# Patient Record
Sex: Female | Born: 1970 | Race: White | Hispanic: No
Health system: Western US, Academic
[De-identification: ages and names within clinical notes are randomized; demographics above are authoritative.]

## PROBLEM LIST (undated history)

## (undated) DIAGNOSIS — C4491 Basal cell carcinoma of skin, unspecified: Secondary | ICD-10-CM

## (undated) DIAGNOSIS — J302 Other seasonal allergic rhinitis: Secondary | ICD-10-CM

## (undated) HISTORY — PX: INNER EAR SURGERY: SHX679

## (undated) HISTORY — PX: MOHS SURGERY: SUR867

## (undated) HISTORY — PX: EYE SURGERY: SHX253

## (undated) HISTORY — PX: WISDOM TOOTH EXTRACTION: SHX21

## (undated) HISTORY — DX: Basal cell carcinoma of skin, unspecified: C44.91

## (undated) HISTORY — DX: Other seasonal allergic rhinitis: J30.2

---
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
## 1990-07-14 DIAGNOSIS — C4491 Basal cell carcinoma of skin, unspecified: Secondary | ICD-10-CM

## 1990-07-14 HISTORY — DX: Basal cell carcinoma of skin, unspecified: C44.91

## 2000-10-16 ENCOUNTER — Ambulatory Visit (HOSPITAL_BASED_OUTPATIENT_CLINIC_OR_DEPARTMENT_OTHER): Admission: RE | Admit: 2000-10-16 | Discharge: 2000-10-16 | Payer: Self-pay | Admitting: Otolaryngology

## 2001-07-23 ENCOUNTER — Other Ambulatory Visit: Admission: RE | Admit: 2001-07-23 | Discharge: 2001-07-23 | Payer: Self-pay | Admitting: Obstetrics and Gynecology

## 2002-08-04 ENCOUNTER — Other Ambulatory Visit: Admission: RE | Admit: 2002-08-04 | Discharge: 2002-08-04 | Payer: Self-pay | Admitting: Obstetrics and Gynecology

## 2002-12-29 ENCOUNTER — Emergency Department (HOSPITAL_COMMUNITY): Admission: EM | Admit: 2002-12-29 | Discharge: 2002-12-30 | Payer: Self-pay | Admitting: Emergency Medicine

## 2002-12-30 ENCOUNTER — Encounter: Payer: Self-pay | Admitting: Emergency Medicine

## 2003-02-28 ENCOUNTER — Other Ambulatory Visit: Admission: RE | Admit: 2003-02-28 | Discharge: 2003-02-28 | Payer: Self-pay | Admitting: Obstetrics and Gynecology

## 2003-08-08 ENCOUNTER — Other Ambulatory Visit: Admission: RE | Admit: 2003-08-08 | Discharge: 2003-08-08 | Payer: Self-pay | Admitting: Obstetrics and Gynecology

## 2004-10-29 ENCOUNTER — Other Ambulatory Visit: Admission: RE | Admit: 2004-10-29 | Discharge: 2004-10-29 | Payer: Self-pay | Admitting: Obstetrics and Gynecology

## 2007-03-19 ENCOUNTER — Ambulatory Visit: Payer: Self-pay | Admitting: Family Medicine

## 2007-08-20 ENCOUNTER — Ambulatory Visit: Admission: RE | Admit: 2007-08-20 | Discharge: 2007-08-20 | Payer: Self-pay | Admitting: Obstetrics and Gynecology

## 2007-08-20 ENCOUNTER — Encounter (INDEPENDENT_AMBULATORY_CARE_PROVIDER_SITE_OTHER): Payer: Self-pay | Admitting: Obstetrics and Gynecology

## 2007-08-20 ENCOUNTER — Ambulatory Visit: Payer: Self-pay | Admitting: Vascular Surgery

## 2007-08-25 ENCOUNTER — Encounter: Admission: RE | Admit: 2007-08-25 | Discharge: 2007-08-25 | Payer: Self-pay | Admitting: Rheumatology

## 2009-03-14 ENCOUNTER — Encounter: Admission: RE | Admit: 2009-03-14 | Discharge: 2009-03-14 | Payer: Self-pay | Admitting: Obstetrics and Gynecology

## 2009-04-25 ENCOUNTER — Ambulatory Visit (HOSPITAL_COMMUNITY): Admission: RE | Admit: 2009-04-25 | Discharge: 2009-04-25 | Payer: Self-pay | Admitting: Family Medicine

## 2010-10-08 ENCOUNTER — Other Ambulatory Visit: Payer: Self-pay | Admitting: Obstetrics and Gynecology

## 2010-10-08 DIAGNOSIS — N6325 Unspecified lump in the left breast, overlapping quadrants: Secondary | ICD-10-CM

## 2010-10-11 ENCOUNTER — Ambulatory Visit
Admission: RE | Admit: 2010-10-11 | Discharge: 2010-10-11 | Disposition: A | Discharge status: Home / Self Care | Payer: Managed Care, Other (non HMO) | Source: Ambulatory Visit | Attending: Obstetrics and Gynecology | Admitting: Obstetrics and Gynecology

## 2010-10-11 DIAGNOSIS — N6325 Unspecified lump in the left breast, overlapping quadrants: Secondary | ICD-10-CM

## 2010-11-29 NOTE — Op Note (Signed)
[place]. [facility]  Patient:    Anna Bauer, Anna Bauer                    MRN: [id number] Proc. Date: 10/16/00 Adm. 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  OPERATION:  Left type 1 tympanoplasty with ossicular reconstruction with torque prosthesis (Goldenberg incus stapes prosthesis).  SURGEON:  Kristine Garbe. Ezzard Standing, M.D.  ANESTHESIA:  General endotracheal.  COMPLICATIONS:  None.  BRIEF CLINICAL NOTE:  The patient is a 40 year old female who has had a chronic ear problem with hearing loss.  On examination of the left ear, she has a large posterior-superior TM perforation with obvious erosion of the long process of the incus.  She has a 30 decibel conductive hearing loss with worsening of her hearing over the last several years.  She has not had any significant drainage from the ear.  She is taken to the operating room at this time for tympanoplasty and ossicular reconstruction.  DESCRIPTION OF PROCEDURE:  After adequate endotracheal anesthesia, the patient received 1 g of Ancef IV preoperatively.  The ear was prepped with Betadine solution, and draped out with sterile towels.  The ear canal was then injected with Xylocaine with 100,000 epinephrine for hemostasis.  A posterior-based tympanomeatal flap was elevated, but the ear canal was very small, and because of this, it was elected to go postauricular.  A postauricular incision was made initially superiorly and a temporalis fascia graft was harvested and sat aside to dry.  The incision was then carried down posteriorly, and the ear was reflected forward, and the ear canal was entered, and the  posterior tympanomeatal flap was elevated via postauricular approach.  Using the drill, the bony canal was enlarged slightly posteriorly.  Because of difficult exposure of the posterior-superior aspect via the postauricular approach, the ________ approach was then utilized using a 6.5 speculum.  The patient had a large posterior-superior perforation extending up to the malleus.  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  The posterior tympanomeatal flap was elevated down to the annulus which was elevated and reflected anteriorly, and the posterior portion of the middle ear space was exposed.  On evaluation of the incus and stapes area, the long parts of the incus was eroded and missing.  Initially, I thought the patient had a stapes super structure, but there was just scar tissue and no bony super structure intact. The scar tissue was removed from the oval window area, and the oval window was visualized.  The oval window was mobile, but there was no stapes super structure.  There was no evidence of a cholesteatoma.  It was elected to use a Goldenberg torque prosthesis to position on the oval window up to the under surface of the malleus.  This was subsequently positioned.  The fascia graft was then placed medial to the tympanomeatal flap overlying the torque prosthesis, and extending up to the malleus.  A separate piece of fascia was used to lie medial to the inferior aspect of the perforation.  The middle ear space was then packed  with Gelfoam soaked in _________.  The tympanomeatal flap was brought back posteriorly over the torque prosthesis.  The perforation site was entirely covered with the fascia graft, and the ear canal was packed with Gelfoam soaked in ________.  The postauricular incision was closed with 3-0 chromic suture subcutaneously and 4-0 nylon on the skin.  A mastoid dressing was applied.  The patient  was awakened from anesthesia and transferred from the recovery room postoperative doing well.  DISPOSITION:  The patient is discharged home on Keflex 500 b.i.d. for a week, Tylenol and Vicodin for pain.  Told to follow up in my office in one week for recheck, and to remove postauricular sutures. DD:  10/16/00 TD:  10/17/00 Job: [id number]

## 2012-07-28 ENCOUNTER — Ambulatory Visit (INDEPENDENT_AMBULATORY_CARE_PROVIDER_SITE_OTHER): Payer: BC Managed Care – PPO | Admitting: Family Medicine

## 2012-07-28 ENCOUNTER — Encounter: Payer: Self-pay | Admitting: Family Medicine

## 2012-07-28 VITALS — BP 120/88 | HR 76 | Temp 98.2°F | Ht 65.0 in | Wt 130.0 lb

## 2012-07-28 DIAGNOSIS — F419 Anxiety disorder, unspecified: Secondary | ICD-10-CM | POA: Insufficient documentation

## 2012-07-28 DIAGNOSIS — R0600 Dyspnea, unspecified: Secondary | ICD-10-CM

## 2012-07-28 DIAGNOSIS — R0609 Other forms of dyspnea: Secondary | ICD-10-CM

## 2012-07-28 DIAGNOSIS — Z136 Encounter for screening for cardiovascular disorders: Secondary | ICD-10-CM

## 2012-07-28 DIAGNOSIS — F411 Generalized anxiety disorder: Secondary | ICD-10-CM

## 2012-07-28 DIAGNOSIS — R079 Chest pain, unspecified: Secondary | ICD-10-CM | POA: Insufficient documentation

## 2012-07-28 DIAGNOSIS — Z23 Encounter for immunization: Secondary | ICD-10-CM

## 2012-07-28 MED ORDER — ALBUTEROL SULFATE HFA 108 (90 BASE) MCG/ACT IN AERS: 2.0000 | INHALATION_SPRAY | Freq: Four times a day (QID) | RESPIRATORY_TRACT | Status: DC | PRN

## 2012-07-28 MED ORDER — AZITHROMYCIN 250 MG PO TABS: ORAL_TABLET | ORAL | Status: DC

## 2012-07-28 NOTE — Addendum Note (Signed)
Addended by: Eliezer Bottom on: 07/28/2012 04:53 PM   Modules accepted: Orders

## 2012-07-28 NOTE — Progress Notes (Signed)
Subjective:    Patient ID: Anna Bauer, female    DOB: 08-23-1970, 42 y.o.   MRN: [id number]  HPI  Very pleasant 42 yo female here to establish care with complaint of chest pain.  Past week and a half, she has noted some chest tightness and DOE. Very active- exercises at the gym regularly.  Notices that she does feel more fatigued and short of breath with exertion. She is a non smoker. Her father has a h/o CAD- first MI at 46.  She has not had lipids checked recently.  She has been under more stress at work.  Has had times when she feels like she is having a panic attack. Denies feeling depressed.  She has had panic attacks in past.  Never has taken any antidepressants or anxiolytics.  Has had some nasal drainage this past week.  Patient Active Problem List  Diagnosis  . Chest pain  . Anxiety  . DOE (dyspnea on exertion)   No past medical history on file. Past Surgical History  Procedure Date  . Tonsillectomy 1978   History  Substance Use Topics  . Smoking status: Former Games developer  . Smokeless tobacco: Not on file     Comment: Smoked for one year as a teenager  . Alcohol Use: Not on file   Family History  Problem Relation Age of Onset  . Heart disease Mother 78    MI  . Hypertension Father    No Known Allergies Current Outpatient Prescriptions on File Prior to Visit  Medication Sig Dispense Refill  . norethindrone-ethinyl estradiol 1/35 (ORTHO-NOVUM, NORTREL,CYCLAFEM) tablet Take 1 tablet by mouth daily.       The PMH, PSH, Social History, Family History, Medications, and allergies have been reviewed in [facility], and have been updated if relevant.    Review of Systems See HPI Denies any changes in her bowel habits Appetite good No cough    Objective:   Physical Exam BP 120/88  Pulse 76  Temp 98.2 F (36.8 C)  Ht 5\' 5"  (1.651 m)  Wt 130 lb (58.968 kg)  BMI 21.63 kg/m2  General:  Well-developed,well-nourished,in no acute distress; alert,appropriate and  cooperative throughout examination Head:  normocephalic and atraumatic.   Eyes:  vision grossly intact, pupils equal, pupils round, and pupils reactive to light.   Ears:  R ear normal and L ear normal.   Nose:  no external deformity.   Lungs:  Normal respiratory effort, chest expands symmetrically.  Wheezes right lower lung base with scattered crackles Heart:  Normal rate and regular rhythm. S1 and S2 normal without gallop, murmur, click, rub or other extra sounds. Abdomen:  Bowel sounds positive,abdomen soft and non-tender without masses, organomegaly or hernias noted. Msk:  No deformity or scoliosis noted of thoracic or lumbar spine.   Extremities:  No clubbing, cyanosis, edema, or deformity noted with normal full range of motion of all joints.   Neurologic:  alert & oriented X3 and gait normal.   Skin:  Intact without suspicious lesions or rashes Psych:  Cognition and judgment appear intact. Alert and cooperative with normal attention span and concentration. No apparent delusions, illusions, hallucinations    Assessment & Plan:   1. Chest pain  New- EKG reassuring- NSR. I suspect this is coming from bronchitis- wheezes heard on exam today.  Given duration and progression of symptoms, will treat with Zpack, Proair as needed. EKG 12-Lead  2. Anxiety  Deteriorated but she would like to defer tx at this time.  She states she already feels less anxious knowing EKG was wnl.   3. DOE (dyspnea on exertion)  New- hopefully will improve with resolution of bronchitis.  Will check labs to rule out other possible contributing factors. The patient indicates understanding of these issues and agrees with the plan.  CBC with Differential, TSH, T4, Free, Comprehensive metabolic panel, EKG 12-Lead  4. Screening for ischemic heart disease  Lipid Panel

## 2012-07-28 NOTE — Patient Instructions (Addendum)
Nice to meet you. Take Zpack as directed. You may use the proair inhaler as needed for shortness of breath, chest tightness. Mucinex for a few days may help break up the secretions.  Call me next week with an update. We will call you with your lab results.

## 2012-07-29 ENCOUNTER — Encounter: Payer: Self-pay | Admitting: *Deleted

## 2012-07-29 LAB — CBC WITH DIFFERENTIAL/PLATELET
Basos: 0 % (ref 0–3)
Eos: 3 % (ref 0–5)
HCT: 41.9 % (ref 34.0–46.6)
Hemoglobin: 13.9 g/dL (ref 11.1–15.9)
Immature Granulocytes: 0 % (ref 0–2)
Lymphocytes Absolute: 2.1 10*3/uL (ref 0.7–3.1)
Monocytes: 9 % (ref 4–12)
RBC: 4.36 x10E6/uL (ref 3.77–5.28)
RDW: 13 % (ref 12.3–15.4)
WBC: 9.4 10*3/uL (ref 3.4–10.8)

## 2012-07-29 LAB — COMPREHENSIVE METABOLIC PANEL
AST: 23 IU/L (ref 0–40)
Albumin/Globulin Ratio: 1.4 (ref 1.1–2.5)
Alkaline Phosphatase: 60 IU/L (ref 39–117)
BUN/Creatinine Ratio: 19 (ref 9–23)
Creatinine, Ser: 0.75 mg/dL (ref 0.57–1.00)
GFR calc non Af Amer: 99 mL/min/{1.73_m2} (ref >59)
Globulin, Total: 3.1 g/dL (ref 1.5–4.5)
Potassium: 4.1 mmol/L (ref 3.5–5.2)
Sodium: 138 mmol/L (ref 134–144)
Total Bilirubin: 0.3 mg/dL (ref 0.0–1.2)

## 2012-07-29 LAB — LIPID PANEL: Chol/HDL Ratio: 2.5 ratio units (ref 0.0–4.4)

## 2012-08-19 ENCOUNTER — Ambulatory Visit (INDEPENDENT_AMBULATORY_CARE_PROVIDER_SITE_OTHER): Payer: BC Managed Care – PPO | Admitting: Family Medicine

## 2012-08-19 ENCOUNTER — Encounter: Payer: Self-pay | Admitting: Family Medicine

## 2012-08-19 VITALS — BP 100/60 | HR 70 | Temp 98.2°F | Ht 65.0 in | Wt 130.5 lb

## 2012-08-19 DIAGNOSIS — M25476 Effusion, unspecified foot: Secondary | ICD-10-CM

## 2012-08-19 DIAGNOSIS — M25473 Effusion, unspecified ankle: Secondary | ICD-10-CM

## 2012-08-19 DIAGNOSIS — M25569 Pain in unspecified knee: Secondary | ICD-10-CM

## 2012-08-19 DIAGNOSIS — R000 Tachycardia, unspecified: Secondary | ICD-10-CM

## 2012-08-19 DIAGNOSIS — M25471 Effusion, right ankle: Secondary | ICD-10-CM

## 2012-08-19 NOTE — Progress Notes (Signed)
Nature conservation officer at [facility] [address] [place] [address] Phone: [phone] Fax: [phone]  Date:  08/19/2012   Name:  Anna Bauer   DOB:  22-Oct-1970   MRN:  [id number] Gender: female Age: 42 y.o.  Primary Physician:  Ruthe Mannan, MD  Evaluating MD: Hannah Beat, MD   Chief Complaint: No chief complaint on file.   History of Present Illness:  Anna Bauer is a 42 y.o. pleasant patient who presents with the following:  R ankle is swelling some and foot - works out about every day. Ran on Monday, and foot is really tender. Took meloxicam. Yesterday afternon started to swel again. More in the true ankle joint, but initially pain was around the base of the 5th MT but not having any there right now.   Last night, felt like her heart was beating really fast and like it was on the outside of er chest every 30 minutes.  Father has CAD, MI at age 43,s/p PCI Also has an AAA Mom has HTN, brother has HTN.   200 mg caffeine pill with breakfast 2 glasses of wine a few nights a week Was drinking a 5 hour energy --- once or twice a week, maybe on the weekend. Will have this intermittently.  1 1/2 every morning, weights for an hour and then cardio.   She also has had chronic R knee pain and has never been given a diagnosis.  Patient Active Problem List  Diagnosis  . Chest pain  . Anxiety  . DOE (dyspnea on exertion)    No past medical history on file.  Past Surgical History  Procedure Date  . Tonsillectomy 1978    History  Substance Use Topics  . Smoking status: Former Games developer  . Smokeless tobacco: Not on file     Comment: Smoked for one year as a teenager  . Alcohol Use: Not on file    Family History  Problem Relation Age of Onset  . Heart disease Mother 41    MI  . Hypertension Father     No Known Allergies  Medication list has been reviewed and updated.  Outpatient Prescriptions Prior to Visit  Medication Sig Dispense Refill  .  norethindrone-ethinyl estradiol 1/35 (ORTHO-NOVUM, NORTREL,CYCLAFEM) tablet Take 1 tablet by mouth daily.      Marland Kitchen albuterol (PROAIR HFA) 108 (90 BASE) MCG/ACT inhaler Inhale 2 puffs into the lungs every 6 (six) hours as needed for wheezing.  1 Inhaler  0  . azithromycin (ZITHROMAX) 250 MG tablet 2 tabs by mouth on day 1 followed by 1 tablet by mouth daily days 2-5  6 tablet  0   Last reviewed on 08/19/2012 11:24 AM by Consuello Masse, CMA  Review of Systems:  Tachycardia without chest pain, no DOE, Msk pain, ankle swelling. Knee swelling.  Physical Examination: BP 100/60  Pulse 70  Temp 98.2 F (36.8 C) (Oral)  Ht 5\' 5"  (1.651 m)  Wt 130 lb 8 oz (59.194 kg)  BMI 21.72 kg/m2  SpO2 98%  Ideal Body Weight: Weight in (lb) to have BMI = 25: 149.9    GEN: Well-developed,well-nourished,in no acute distress; alert,appropriate and cooperative throughout examination HEENT: Normocephalic and atraumatic without obvious abnormalities. Ears, externally no deformities CV: RRR, no m/g/r PULM: Breathing comfortably in no respiratory distress EXT: No clubbing, cyanosis, or edema PSYCH: Normally interactive. Cooperative during the interview. Pleasant. Friendly and conversant. Not anxious or depressed appearing. Normal, full affect.  ANKLE: R Echymosis: no  Edema: mild swelling, mostly around true ankle joint anteriorly HOMANS neg ROM: Full dorsi and plantar flexion, inversion, eversion Gait: heel toe, non-antalgic HOP TEST NEG B Lateral Mall: NT Medial Mall: NT Talus: NT Navicular: NT Cuboid: NT Calcaneous: NT Metatarsals: NT 5th MT: NT Phalanges: NT Achilles: NT Plantar Fascia: NT Fat Pad: NT Peroneals: NT Post Tib: NT Great Toe: Nml motion Ant Drawer: neg Talar Tilt: neg ATFL: NT CFL: NT Deltoid: NT Str: 5/5 Other Special tests: kleiger neg Sensation: intact  R knee: 0-110. Moderate effusion. Stable ACL, PCL, MCL, LCL. mcmurrays and flexion pinch are pos.  Assessment and  Plan:  1. Tachycardia  EKG 12-Lead, EKG 12-Lead  2. Right ankle swelling    3. Knee pain     EKG: Sinus bradycardia. Normal axis, normal R wave progression, No acute ST elevation or depression.   Unsustained few beat tachycardia while ingesting 200-500 mg of caffeine most likely PVC's. Stop caffeine, and follow clinically.  R ankle - true ankle joint. Mild effusion. Decrease impact for this week and take NSAIDS.  R knee suspect probable internal derangement given exam, and I would not be surprised if there is meniscal pathology, but pt wants to hold off on further eval now  Orders Today:  Orders Placed This Encounter  Procedures  . EKG 12-Lead    Standing Status: Standing     Number of Occurrences: 1     Standing Expiration Date:     Order Specific Question:  Reason for Exam    Answer:  tachycardia    Updated Medication List: (Includes new medications, updates to list, dose adjustments) No orders of the defined types were placed in this encounter.    Medications Discontinued: Medications Discontinued During This Encounter  Medication Reason  . azithromycin (ZITHROMAX) 250 MG tablet Error  . albuterol (PROAIR HFA) 108 (90 BASE) MCG/ACT inhaler Error     Signed, Kallon Caylor T. Kalen Ratajczak, MD 08/19/2012 11:39 AM

## 2013-02-11 ENCOUNTER — Telehealth: Payer: Self-pay

## 2013-02-11 NOTE — Telephone Encounter (Signed)
Pt request letter stating proof of CPX on 07/28/12 for pt's yearly insurance requirement. Pt was seen on 07/28/12 as new pt appt to establish care with complaint of chest pain, will that be considered CPX? Please advise. Pt request cb.

## 2013-02-11 NOTE — Telephone Encounter (Signed)
Advised patient, appt scheduled for later in august.

## 2013-02-11 NOTE — Telephone Encounter (Signed)
We did do CPX labs basically but I cannot count what we did a "CPX."

## 2013-03-10 ENCOUNTER — Encounter: Payer: Self-pay | Admitting: Family Medicine

## 2013-03-10 ENCOUNTER — Ambulatory Visit (INDEPENDENT_AMBULATORY_CARE_PROVIDER_SITE_OTHER): Payer: BC Managed Care – PPO | Admitting: Family Medicine

## 2013-03-10 VITALS — BP 110/60 | HR 72 | Temp 97.9°F | Ht 65.25 in | Wt 128.0 lb

## 2013-03-10 DIAGNOSIS — F419 Anxiety disorder, unspecified: Secondary | ICD-10-CM

## 2013-03-10 DIAGNOSIS — M25561 Pain in right knee: Secondary | ICD-10-CM | POA: Insufficient documentation

## 2013-03-10 DIAGNOSIS — M25569 Pain in unspecified knee: Secondary | ICD-10-CM

## 2013-03-10 DIAGNOSIS — Z0000 Encounter for general adult medical examination without abnormal findings: Secondary | ICD-10-CM | POA: Insufficient documentation

## 2013-03-10 DIAGNOSIS — F411 Generalized anxiety disorder: Secondary | ICD-10-CM

## 2013-03-10 NOTE — Patient Instructions (Addendum)
Good to see you. We will call you with your orthopedist referral.

## 2013-03-10 NOTE — Progress Notes (Signed)
Subjective:    Patient ID: Anna Bauer, female    DOB: Mar 24, 1971, 42 y.o.   MRN: [id number]  HPI  Very pleasant 42 yo female here for CPX, no pap.  Established care with me in 07/2012.  Palpitations have improved, she feels it was work stressors.   Lab Results  Component Value Date   HDL 66 07/28/2012   LDLCALC 82 07/28/2012   TRIG 83 07/28/2012   CHOLHDL 2.5 07/28/2012   Lab Results  Component Value Date   WBC 9.4 07/28/2012   HGB 13.9 07/28/2012   HCT 41.9 07/28/2012   MCV 96 07/28/2012   Lab Results  Component Value Date   NA 138 07/28/2012   K 4.1 07/28/2012   CL 100 07/28/2012   CO2 22 07/28/2012   Right knee pain and swelling- ongoing for several years.  Saw Dr. Patsy Lager.  Per pt, he advised referral to ortho. Xrays unremarkable.  Patient Active Problem List   Diagnosis Date Noted  . Routine general medical examination at a health care facility 03/10/2013  . Chest pain 07/28/2012  . Anxiety 07/28/2012  . DOE (dyspnea on exertion) 07/28/2012   No past medical history on file. Past Surgical History  Procedure Laterality Date  . Tonsillectomy  1978   History  Substance Use Topics  . Smoking status: Former Games developer  . Smokeless tobacco: Not on file     Comment: Smoked for one year as a teenager  . Alcohol Use: Not on file   Family History  Problem Relation Age of Onset  . Heart disease Mother 33    MI  . Hypertension Father    No Known Allergies Current Outpatient Prescriptions on File Prior to Visit  Medication Sig Dispense Refill  . norethindrone-ethinyl estradiol 1/35 (ORTHO-NOVUM, NORTREL,CYCLAFEM) tablet Take 1 tablet by mouth daily.       No current facility-administered medications on file prior to visit.   The PMH, PSH, Social History, Family History, Medications, and allergies have been reviewed in [facility], and have been updated if relevant.    Review of Systems See HPI Denies any changes in her bowel habits Appetite good No cough     Objective:   Physical Exam BP 110/60  Pulse 72  Temp(Src) 97.9 F (36.6 C)  Ht 5' 5.25" (1.657 m)  Wt 128 lb (58.06 kg)  BMI 21.15 kg/m2  General:  Well-developed,well-nourished,in no acute distress; alert,appropriate and cooperative throughout examination Head:  normocephalic and atraumatic.   Eyes:  vision grossly intact, pupils equal, pupils round, and pupils reactive to light.   Ears:  R ear normal and L ear normal.   Nose:  no external deformity.   Lungs:  Normal respiratory effort, chest expands symmetrically.  Heart:  Normal rate and regular rhythm. S1 and S2 normal without gallop, murmur, click, rub or other extra sounds. Abdomen:  Bowel sounds positive,abdomen soft and non-tender without masses, organomegaly or hernias noted. Msk:  No deformity or scoliosis noted of thoracic or lumbar spine.   Extremities:  No clubbing, cyanosis, edema, or deformity noted with normal full range of motion of all joints.   Neurologic:  alert & oriented X3 and gait normal.   Skin:  Intact without suspicious lesions or rashes Psych:  Cognition and judgment appear intact. Alert and cooperative with normal attention span and concentration. No apparent delusions, illusions, hallucinations    Assessment & Plan:   1. Routine general medical examination at a health care facility Reviewed preventive care protocols, scheduled due  services, and updated immunizations Discussed nutrition, exercise, diet, and healthy lifestyle.   2. Anxiety Improved- feels she is coping better.  3. Right knee pain Persistent.  Refer to ortho. The patient indicates understanding of these issues and agrees with the plan.  - Ambulatory referral to Orthopedic Surgery

## 2013-10-27 ENCOUNTER — Other Ambulatory Visit: Payer: Self-pay | Admitting: Obstetrics and Gynecology

## 2013-10-27 DIAGNOSIS — R928 Other abnormal and inconclusive findings on diagnostic imaging of breast: Secondary | ICD-10-CM

## 2013-11-07 ENCOUNTER — Ambulatory Visit
Admission: RE | Admit: 2013-11-07 | Discharge: 2013-11-07 | Disposition: A | Discharge status: Home / Self Care | Payer: BC Managed Care – PPO | Source: Ambulatory Visit | Attending: Obstetrics and Gynecology | Admitting: Obstetrics and Gynecology

## 2013-11-07 DIAGNOSIS — R928 Other abnormal and inconclusive findings on diagnostic imaging of breast: Secondary | ICD-10-CM

## 2015-04-12 ENCOUNTER — Other Ambulatory Visit: Payer: Self-pay | Admitting: Family Medicine

## 2015-04-12 DIAGNOSIS — Z0000 Encounter for general adult medical examination without abnormal findings: Secondary | ICD-10-CM

## 2015-04-13 ENCOUNTER — Other Ambulatory Visit (INDEPENDENT_AMBULATORY_CARE_PROVIDER_SITE_OTHER): Payer: BLUE CROSS/BLUE SHIELD

## 2015-04-13 DIAGNOSIS — Z0000 Encounter for general adult medical examination without abnormal findings: Secondary | ICD-10-CM | POA: Diagnosis not present

## 2015-04-14 LAB — CBC WITH DIFFERENTIAL/PLATELET
BASOS: 0 %
Basophils Absolute: 0 10*3/uL (ref 0.0–0.2)
EOS (ABSOLUTE): 0.3 10*3/uL (ref 0.0–0.4)
EOS: 5 %
HEMATOCRIT: 39.8 % (ref 34.0–46.6)
HEMOGLOBIN: 13.1 g/dL (ref 11.1–15.9)
IMMATURE GRANS (ABS): 0 10*3/uL (ref 0.0–0.1)
IMMATURE GRANULOCYTES: 0 %
LYMPHS: 29 %
Lymphocytes Absolute: 1.5 10*3/uL (ref 0.7–3.1)
MCH: 31.8 pg (ref 26.6–33.0)
MCHC: 32.9 g/dL (ref 31.5–35.7)
MCV: 97 fL (ref 79–97)
Monocytes Absolute: 0.5 10*3/uL (ref 0.1–0.9)
Monocytes: 10 %
NEUTROS PCT: 56 %
Neutrophils Absolute: 2.9 10*3/uL (ref 1.4–7.0)
Platelets: 209 10*3/uL (ref 150–379)
RBC: 4.12 x10E6/uL (ref 3.77–5.28)
RDW: 13 % (ref 12.3–15.4)
WBC: 5.2 10*3/uL (ref 3.4–10.8)

## 2015-04-14 LAB — COMPREHENSIVE METABOLIC PANEL
ALBUMIN: 4 g/dL (ref 3.5–5.5)
ALK PHOS: 38 IU/L — AB (ref 39–117)
ALT: 11 IU/L (ref 0–32)
AST: 16 IU/L (ref 0–40)
Albumin/Globulin Ratio: 1.4 (ref 1.1–2.5)
BILIRUBIN TOTAL: 0.4 mg/dL (ref 0.0–1.2)
BUN / CREAT RATIO: 14 (ref 9–23)
BUN: 11 mg/dL (ref 6–24)
CHLORIDE: 101 mmol/L (ref 97–108)
CO2: 22 mmol/L (ref 18–29)
Calcium: 9.2 mg/dL (ref 8.7–10.2)
Creatinine, Ser: 0.8 mg/dL (ref 0.57–1.00)
GFR calc Af Amer: 104 mL/min/{1.73_m2} (ref >59)
GFR calc non Af Amer: 90 mL/min/{1.73_m2} (ref >59)
GLUCOSE: 83 mg/dL (ref 65–99)
Globulin, Total: 2.8 g/dL (ref 1.5–4.5)
Potassium: 4.8 mmol/L (ref 3.5–5.2)
Sodium: 137 mmol/L (ref 134–144)
Total Protein: 6.8 g/dL (ref 6.0–8.5)

## 2015-04-14 LAB — LIPID PANEL
CHOL/HDL RATIO: 2.6 ratio (ref 0.0–4.4)
Cholesterol, Total: 158 mg/dL (ref 100–199)
HDL: 61 mg/dL (ref >39)
LDL CALC: 84 mg/dL (ref 0–99)
Triglycerides: 63 mg/dL (ref 0–149)
VLDL CHOLESTEROL CAL: 13 mg/dL (ref 5–40)

## 2015-04-14 LAB — TSH: TSH: 1.8 u[IU]/mL (ref 0.450–4.500)

## 2015-04-16 ENCOUNTER — Ambulatory Visit (INDEPENDENT_AMBULATORY_CARE_PROVIDER_SITE_OTHER): Payer: BLUE CROSS/BLUE SHIELD | Admitting: Family Medicine

## 2015-04-16 ENCOUNTER — Encounter: Payer: Self-pay | Admitting: Family Medicine

## 2015-04-16 VITALS — BP 112/66 | HR 71 | Temp 98.1°F | Ht 64.75 in | Wt 137.8 lb

## 2015-04-16 DIAGNOSIS — Z0000 Encounter for general adult medical examination without abnormal findings: Secondary | ICD-10-CM

## 2015-04-16 DIAGNOSIS — F419 Anxiety disorder, unspecified: Secondary | ICD-10-CM

## 2015-04-16 NOTE — Progress Notes (Signed)
Subjective:    Patient ID: Anna Bauer, female    DOB: [birth year], 44 y.o.   MRN: [id number]  HPI  Very pleasant 44 yo female here for CPX, no pap. Has OBGYN- Dr. Matthew Saras. Pap and mammogram done in 12/2014- per pt, both negative.  On OCPs.  Has no complaints today .  Has a dermatologist. Lab Results  Component Value Date   NA 137 04/13/2015   K 4.8 04/13/2015   CL 101 04/13/2015   CO2 22 04/13/2015   Lab Results  Component Value Date   CREATININE 0.80 04/13/2015   Lab Results  Component Value Date   ALT 11 04/13/2015   AST 16 04/13/2015   ALKPHOS 38* 04/13/2015   BILITOT 0.4 04/13/2015      Lab Results  Component Value Date   CHOL 158 04/13/2015   HDL 61 04/13/2015   LDLCALC 84 04/13/2015   TRIG 63 04/13/2015   CHOLHDL 2.6 04/13/2015   Lab Results  Component Value Date   WBC 5.2 04/13/2015   HGB 13.9 07/28/2012   HCT 39.8 04/13/2015   MCV 96 07/28/2012      Patient Active Problem List   Diagnosis Date Noted  . Routine general medical examination at a health care facility 03/10/2013  . Anxiety 07/28/2012   History reviewed. No pertinent past medical history. Past Surgical History  Procedure Laterality Date  . Tonsillectomy  1978   Social History  Substance Use Topics  . Smoking status: Former Research scientist (life sciences)  . Smokeless tobacco: None     Comment: Smoked for one year as a teenager  . Alcohol Use: None   Family History  Problem Relation Age of Onset  . Heart disease Mother 57    MI  . Hypertension Father    No Known Allergies Current Outpatient Prescriptions on File Prior to Visit  Medication Sig Dispense Refill  . norethindrone-ethinyl estradiol 1/35 (ORTHO-NOVUM, NORTREL,CYCLAFEM) tablet Take 1 tablet by mouth daily.     No current facility-administered medications on file prior to visit.   The PMH, PSH, Social History, Family History, Medications, and allergies have been reviewed in [facility], and have been updated if relevant.  Review of  Systems  Constitutional: Negative.   HENT: Negative.   Eyes: Negative.   Respiratory: Negative.   Cardiovascular: Negative.   Gastrointestinal: Negative.   Endocrine: Negative.   Genitourinary: Negative.   Musculoskeletal: Negative.   Skin: Negative.   Allergic/Immunologic: Negative.   Neurological: Negative.   Hematological: Negative.   Psychiatric/Behavioral: Negative.   All other systems reviewed and are negative.      Objective:   Physical Exam BP 112/66 mmHg  Pulse 71  Temp(Src) 98.1 F (36.7 C) (Oral)  Ht 5' 4.75" (1.645 m)  Wt 137 lb 12 oz (62.483 kg)  BMI 23.09 kg/m2  SpO2 99%  General:  Well-developed,well-nourished,in no acute distress; alert,appropriate and cooperative throughout examination Head:  normocephalic and atraumatic.   Eyes:  vision grossly intact, pupils equal, pupils round, and pupils reactive to light.   Ears:  R ear normal and L ear normal.   Nose:  no external deformity.   Lungs:  Normal respiratory effort, chest expands symmetrically.  Heart:  Normal rate and regular rhythm. S1 and S2 normal without gallop, murmur, click, rub or other extra sounds. Abdomen:  Bowel sounds positive,abdomen soft and non-tender without masses, organomegaly or hernias noted. Msk:  No deformity or scoliosis noted of thoracic or lumbar spine.   Extremities:  No clubbing, cyanosis, edema,  or deformity noted with normal full range of motion of all joints.   Neurologic:  alert & oriented X3 and gait normal.   Skin:  Intact without suspicious lesions or rashes Psych:  Cognition and judgment appear intact. Alert and cooperative with normal attention span and concentration. No apparent delusions, illusions, hallucinations    Assessment & Plan:

## 2015-04-16 NOTE — Assessment & Plan Note (Signed)
Reviewed preventive care protocols, scheduled due services, and updated immunizations Discussed nutrition, exercise, diet, and healthy lifestyle.  Influenza vaccine given today. 

## 2015-04-16 NOTE — Progress Notes (Signed)
Pre visit review using our clinic review tool, if applicable. No additional management support is needed unless otherwise documented below in the visit note. 

## 2015-04-17 ENCOUNTER — Other Ambulatory Visit: Payer: Self-pay | Admitting: Family Medicine

## 2015-04-17 DIAGNOSIS — Z13220 Encounter for screening for lipoid disorders: Secondary | ICD-10-CM

## 2015-04-17 DIAGNOSIS — Z114 Encounter for screening for human immunodeficiency virus [HIV]: Secondary | ICD-10-CM

## 2015-04-17 DIAGNOSIS — Z0000 Encounter for general adult medical examination without abnormal findings: Secondary | ICD-10-CM

## 2015-08-07 ENCOUNTER — Encounter: Payer: Self-pay | Admitting: Family Medicine

## 2015-08-07 ENCOUNTER — Ambulatory Visit (INDEPENDENT_AMBULATORY_CARE_PROVIDER_SITE_OTHER): Payer: BLUE CROSS/BLUE SHIELD | Admitting: Family Medicine

## 2015-08-07 VITALS — BP 110/62 | HR 69 | Temp 97.9°F | Wt 141.5 lb

## 2015-08-07 DIAGNOSIS — R21 Rash and other nonspecific skin eruption: Secondary | ICD-10-CM | POA: Diagnosis not present

## 2015-08-07 MED ORDER — FLUOCINONIDE-E 0.05 % EX CREA: 1.0000 | TOPICAL_CREAM | Freq: Two times a day (BID) | CUTANEOUS | Status: DC

## 2015-08-07 NOTE — Patient Instructions (Signed)
Eczema Eczema, also called atopic dermatitis, is a skin disorder that causes inflammation of the skin. It causes a red rash and dry, scaly skin. The skin becomes very itchy. Eczema is generally worse during the cooler winter months and often improves with the warmth of summer. Eczema usually starts showing signs in infancy. Some children outgrow eczema, but it may last through adulthood.  CAUSES  The exact cause of eczema is not known, but it appears to run in families. People with eczema often have a family history of eczema, allergies, asthma, or hay fever. Eczema is not contagious. Flare-ups of the condition may be caused by:   Contact with something you are sensitive or allergic to.   Stress. SIGNS AND SYMPTOMS  Dry, scaly skin.   Red, itchy rash.   Itchiness. This may occur before the skin rash and may be very intense.  DIAGNOSIS  The diagnosis of eczema is usually made based on symptoms and medical history. TREATMENT  Eczema cannot be cured, but symptoms usually can be controlled with treatment and other strategies. A treatment plan might include:  Controlling the itching and scratching.   Use over-the-counter antihistamines as directed for itching. This is especially useful at night when the itching tends to be worse.   Use over-the-counter steroid creams as directed for itching.   Avoid scratching. Scratching makes the rash and itching worse. It may also result in a skin infection (impetigo) due to a break in the skin caused by scratching.   Keeping the skin well moisturized with creams every day. This will seal in moisture and help prevent dryness. Lotions that contain alcohol and water should be avoided because they can dry the skin.   Limiting exposure to things that you are sensitive or allergic to (allergens).   Recognizing situations that cause stress.   Developing a plan to manage stress.  HOME CARE INSTRUCTIONS   Only take over-the-counter or  prescription medicines as directed by your health care provider.   Do not use anything on the skin without checking with your health care provider.   Keep baths or showers short (5 minutes) in warm (not hot) water. Use mild cleansers for bathing. These should be unscented. You may add nonperfumed bath oil to the bath water. It is best to avoid soap and bubble bath.   Immediately after a bath or shower, when the skin is still damp, apply a moisturizing ointment to the entire body. This ointment should be a petroleum ointment. This will seal in moisture and help prevent dryness. The thicker the ointment, the better. These should be unscented.   Keep fingernails cut short. Children with eczema may need to wear soft gloves or mittens at night after applying an ointment.   Dress in clothes made of cotton or cotton blends. Dress lightly, because heat increases itching.   A child with eczema should stay away from anyone with fever blisters or cold sores. The virus that causes fever blisters (herpes simplex) can cause a serious skin infection in children with eczema. SEEK MEDICAL CARE IF:   Your itching interferes with sleep.   Your rash gets worse or is not better within 1 week after starting treatment.   You see pus or soft yellow scabs in the rash area.   You have a fever.   You have a rash flare-up after contact with someone who has fever blisters.    This information is not intended to replace advice given to you by your health care   provider. Make sure you discuss any questions you have with your health care provider.   Document Released: 06/27/2000 Document Revised: 04/20/2013 Document Reviewed: 01/31/2013 Elsevier Interactive Patient Education 2016 [employer]  

## 2015-08-07 NOTE — Assessment & Plan Note (Signed)
New- consistent with eczema. eRx sent for topical lidex twice daily. Advised using lotions especially after warm showers for moisturizing. Trial of oral antihistamines. Call or return to clinic prn if these symptoms worsen or fail to improve as anticipated. The patient indicates understanding of these issues and agrees with the plan.

## 2015-08-07 NOTE — Progress Notes (Signed)
Pre visit review using our clinic review tool, if applicable. No additional management support is needed unless otherwise documented below in the visit note. 

## 2015-08-07 NOTE — Progress Notes (Signed)
   Subjective:   Patient ID: Anna Bauer, female    DOB: 1970-11-19, 45 y.o.   MRN: [id number]  Anna Bauer is a pleasant 45 y.o. year old female who presents to clinic today with Rash  on 08/07/2015  HPI: Two years of intermittent, itchy rash- worse in winter, dry months. Typically involves elbows, hands, legs.  Using OTC cortaid which helps a little.  Mom has eczema.  She has not tried any other topical rxs or antihistamines.  She has no known allergies to drugs, foods, pets.  Current Outpatient Prescriptions on File Prior to Visit  Medication Sig Dispense Refill  . norethindrone-ethinyl estradiol 1/35 (ORTHO-NOVUM, NORTREL,CYCLAFEM) tablet Take 1 tablet by mouth daily.     No current facility-administered medications on file prior to visit.    No Known Allergies  No past medical history on file.  Past Surgical History  Procedure Laterality Date  . Tonsillectomy  1978    Family History  Problem Relation Age of Onset  . Heart disease Mother 26    MI  . Hypertension Father     Social History   Social History  . Marital Status: Married    Spouse Name: N/A  . Number of Children: N/A  . Years of Education: N/A   Occupational History  . Not on file.   Social History Main Topics  . Smoking status: Former Research scientist (life sciences)  . Smokeless tobacco: Not on file     Comment: Smoked for one year as a teenager  . Alcohol Use: Not on file  . Drug Use: Not on file  . Sexual Activity: Not on file   Other Topics Concern  . Not on file   Social History Narrative   Married.   One child- 38 year old son, lives in [place].   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   The PMH, PSH, Social History, Family History, Medications, and allergies have been reviewed in [facility], and have been updated if relevant.   Review of Systems  Constitutional: Negative.   HENT: Negative.   Respiratory: Negative.   Cardiovascular: Negative.   Skin: Positive for rash.  Allergic/Immunologic: Negative.     Neurological: Negative.   Hematological: Negative.   Psychiatric/Behavioral: Negative.   All other systems reviewed and are negative.      Objective:    BP 110/62 mmHg  Pulse 69  Temp(Src) 97.9 F (36.6 C) (Oral)  Wt 141 lb 8 oz (64.184 kg)  SpO2 100%   Physical Exam  Constitutional: She is oriented to person, place, and time. She appears well-developed and well-nourished. No distress.  HENT:  Head: Normocephalic.  Eyes: Conjunctivae are normal.  Cardiovascular: Normal rate.   Pulmonary/Chest: Effort normal.  Musculoskeletal: Normal range of motion.  Neurological: She is alert and oriented to person, place, and time. No cranial nerve deficit.  Skin: She is not diaphoretic.     Psychiatric: She has a normal mood and affect. Her behavior is normal. Judgment and thought content normal.  Nursing note and vitals reviewed.         Assessment & Plan:   Rash and nonspecific skin eruption No Follow-up on file.

## 2015-08-13 ENCOUNTER — Other Ambulatory Visit: Payer: Self-pay | Admitting: Family Medicine

## 2015-08-14 MED ORDER — FLUOCINONIDE-E 0.05 % EX CREA: 1.0000 "application " | TOPICAL_CREAM | Freq: Two times a day (BID) | CUTANEOUS | Status: DC

## 2016-02-11 ENCOUNTER — Ambulatory Visit: Payer: BLUE CROSS/BLUE SHIELD | Admitting: Family Medicine

## 2016-02-18 ENCOUNTER — Encounter: Payer: Self-pay | Admitting: Family Medicine

## 2016-02-18 ENCOUNTER — Ambulatory Visit (INDEPENDENT_AMBULATORY_CARE_PROVIDER_SITE_OTHER): Payer: BLUE CROSS/BLUE SHIELD | Admitting: Family Medicine

## 2016-02-18 NOTE — Progress Notes (Signed)
   Subjective:   Patient ID: Anna Bauer, female    DOB: 01/28/1971, 45 y.o.   MRN: [id number]  Krist Rufo is a pleasant 45 y.o. year old female who presents to clinic today with No chief complaint on file.  on 02/18/2016  HPI: Appointment canceled.   Review of Systems     Objective:    There were no vitals taken for this visit.   Physical Exam        Assessment & Plan:   No diagnosis found. No Follow-up on file.

## 2016-02-18 NOTE — Progress Notes (Signed)
Pre visit review using our clinic review tool, if applicable. No additional management support is needed unless otherwise documented below in the visit note. 

## 2016-03-25 DIAGNOSIS — Z01419 Encounter for gynecological examination (general) (routine) without abnormal findings: Secondary | ICD-10-CM | POA: Diagnosis not present

## 2016-03-25 DIAGNOSIS — Z1231 Encounter for screening mammogram for malignant neoplasm of breast: Secondary | ICD-10-CM | POA: Diagnosis not present

## 2016-03-25 DIAGNOSIS — Z6823 Body mass index (BMI) 23.0-23.9, adult: Secondary | ICD-10-CM | POA: Diagnosis not present

## 2016-03-31 ENCOUNTER — Other Ambulatory Visit: Payer: Self-pay | Admitting: Obstetrics and Gynecology

## 2016-03-31 DIAGNOSIS — R928 Other abnormal and inconclusive findings on diagnostic imaging of breast: Secondary | ICD-10-CM

## 2016-04-07 ENCOUNTER — Ambulatory Visit
Admission: RE | Admit: 2016-04-07 | Discharge: 2016-04-07 | Disposition: A | Discharge status: Home / Self Care | Payer: BLUE CROSS/BLUE SHIELD | Source: Ambulatory Visit | Attending: Obstetrics and Gynecology | Admitting: Obstetrics and Gynecology

## 2016-04-07 DIAGNOSIS — R928 Other abnormal and inconclusive findings on diagnostic imaging of breast: Secondary | ICD-10-CM

## 2016-04-07 DIAGNOSIS — R922 Inconclusive mammogram: Secondary | ICD-10-CM | POA: Diagnosis not present

## 2016-04-17 ENCOUNTER — Other Ambulatory Visit: Payer: Self-pay | Admitting: Family Medicine

## 2016-04-17 DIAGNOSIS — Z01419 Encounter for gynecological examination (general) (routine) without abnormal findings: Secondary | ICD-10-CM

## 2016-04-18 ENCOUNTER — Other Ambulatory Visit (INDEPENDENT_AMBULATORY_CARE_PROVIDER_SITE_OTHER): Payer: BLUE CROSS/BLUE SHIELD

## 2016-04-18 DIAGNOSIS — Z01419 Encounter for gynecological examination (general) (routine) without abnormal findings: Secondary | ICD-10-CM

## 2016-04-18 DIAGNOSIS — Z0000 Encounter for general adult medical examination without abnormal findings: Secondary | ICD-10-CM | POA: Diagnosis not present

## 2016-04-18 NOTE — Addendum Note (Signed)
Addended by: Ellamae Sia on: 04/18/2016 08:22 AM   Modules accepted: Orders

## 2016-04-19 LAB — CBC WITH DIFFERENTIAL/PLATELET
Basophils Absolute: 0 10*3/uL (ref 0.0–0.2)
Basos: 1 %
EOS (ABSOLUTE): 0.2 10*3/uL (ref 0.0–0.4)
EOS: 5 %
HEMATOCRIT: 41.8 % (ref 34.0–46.6)
HEMOGLOBIN: 13.5 g/dL (ref 11.1–15.9)
IMMATURE GRANS (ABS): 0 10*3/uL (ref 0.0–0.1)
IMMATURE GRANULOCYTES: 0 %
LYMPHS: 34 %
Lymphocytes Absolute: 1.7 10*3/uL (ref 0.7–3.1)
MCH: 32.1 pg (ref 26.6–33.0)
MCHC: 32.3 g/dL (ref 31.5–35.7)
MCV: 99 fL — ABNORMAL HIGH (ref 79–97)
MONOCYTES: 10 %
MONOS ABS: 0.5 10*3/uL (ref 0.1–0.9)
NEUTROS PCT: 50 %
Neutrophils Absolute: 2.5 10*3/uL (ref 1.4–7.0)
Platelets: 282 10*3/uL (ref 150–379)
RBC: 4.21 x10E6/uL (ref 3.77–5.28)
RDW: 12.7 % (ref 12.3–15.4)
WBC: 4.9 10*3/uL (ref 3.4–10.8)

## 2016-04-19 LAB — LIPID PANEL
CHOL/HDL RATIO: 2.7 ratio (ref 0.0–4.4)
Cholesterol, Total: 164 mg/dL (ref 100–199)
HDL: 61 mg/dL (ref >39)
LDL CALC: 89 mg/dL (ref 0–99)
Triglycerides: 70 mg/dL (ref 0–149)
VLDL Cholesterol Cal: 14 mg/dL (ref 5–40)

## 2016-04-19 LAB — COMPREHENSIVE METABOLIC PANEL
ALT: 10 IU/L (ref 0–32)
AST: 14 IU/L (ref 0–40)
Albumin/Globulin Ratio: 1.2 (ref 1.2–2.2)
Albumin: 3.8 g/dL (ref 3.5–5.5)
Alkaline Phosphatase: 41 IU/L (ref 39–117)
BUN/Creatinine Ratio: 14 (ref 9–23)
BUN: 12 mg/dL (ref 6–24)
Bilirubin Total: 0.5 mg/dL (ref 0.0–1.2)
CALCIUM: 9.3 mg/dL (ref 8.7–10.2)
CO2: 22 mmol/L (ref 18–29)
Chloride: 101 mmol/L (ref 96–106)
Creatinine, Ser: 0.87 mg/dL (ref 0.57–1.00)
GFR calc Af Amer: 93 mL/min/{1.73_m2} (ref >59)
GFR, EST NON AFRICAN AMERICAN: 81 mL/min/{1.73_m2} (ref >59)
GLOBULIN, TOTAL: 3.2 g/dL (ref 1.5–4.5)
Glucose: 82 mg/dL (ref 65–99)
POTASSIUM: 4.8 mmol/L (ref 3.5–5.2)
Sodium: 137 mmol/L (ref 134–144)
TOTAL PROTEIN: 7 g/dL (ref 6.0–8.5)

## 2016-04-19 LAB — ABO/RH: Rh Factor: POSITIVE

## 2016-04-19 LAB — TSH: TSH: 2.88 u[IU]/mL (ref 0.450–4.500)

## 2016-04-22 ENCOUNTER — Encounter: Payer: Self-pay | Admitting: Family Medicine

## 2016-04-22 ENCOUNTER — Ambulatory Visit (INDEPENDENT_AMBULATORY_CARE_PROVIDER_SITE_OTHER): Payer: BLUE CROSS/BLUE SHIELD | Admitting: Family Medicine

## 2016-04-22 VITALS — BP 108/74 | HR 71 | Temp 97.8°F | Ht 64.75 in | Wt 141.5 lb

## 2016-04-22 DIAGNOSIS — Z23 Encounter for immunization: Secondary | ICD-10-CM

## 2016-04-22 DIAGNOSIS — Z0000 Encounter for general adult medical examination without abnormal findings: Secondary | ICD-10-CM | POA: Diagnosis not present

## 2016-04-22 DIAGNOSIS — Z01419 Encounter for gynecological examination (general) (routine) without abnormal findings: Secondary | ICD-10-CM

## 2016-04-22 NOTE — Progress Notes (Signed)
Pre visit review using our clinic review tool, if applicable. No additional management support is needed unless otherwise documented below in the visit note. 

## 2016-04-22 NOTE — Patient Instructions (Signed)
Great to see you. Your blood work looks great!

## 2016-04-22 NOTE — Progress Notes (Signed)
Subjective:    Patient ID: Anna Bauer, female    DOB: May 28, 1971, 45 y.o.   MRN: [id number]  HPI  Very pleasant 45 yo female here for CPX, no pap. Has OBGYN- Dr. Matthew Saras. Pap and mammogram done in 03/2016- per pt, both negative.  On OCPs.   Has a dermatologist. Lab Results  Component Value Date   NA 137 04/18/2016   K 4.8 04/18/2016   CL 101 04/18/2016   CO2 22 04/18/2016   Lab Results  Component Value Date   CREATININE 0.87 04/18/2016   Lab Results  Component Value Date   ALT 10 04/18/2016   AST 14 04/18/2016   ALKPHOS 41 04/18/2016   BILITOT 0.5 04/18/2016      Lab Results  Component Value Date   CHOL 164 04/18/2016   HDL 61 04/18/2016   LDLCALC 89 04/18/2016   TRIG 70 04/18/2016   CHOLHDL 2.7 04/18/2016   Lab Results  Component Value Date   WBC 4.9 04/18/2016   HGB 13.9 07/28/2012   HCT 41.8 04/18/2016   MCV 99 (H) 04/18/2016   PLT 282 04/18/2016      Patient Active Problem List  Diagnosis  . Well woman exam   No past medical history on file. Past Surgical History:  Procedure Laterality Date  . TONSILLECTOMY  1978   Social History  Substance Use Topics  . Smoking status: Former Research scientist (life sciences)  . Smokeless tobacco: Not on file     Comment: Smoked for one year as a teenager  . Alcohol use Not on file   Family History  Problem Relation Age of Onset  . Heart disease Mother 46    MI  . Hypertension Father    No Known Allergies Current Outpatient Prescriptions on File Prior to Visit  Medication Sig Dispense Refill  . fluocinonide-emollient (LIDEX-E) 0.05 % cream Apply 1 application topically 2 (two) times daily. 30 g 5  . norethindrone-ethinyl estradiol 1/35 (ORTHO-NOVUM, NORTREL,CYCLAFEM) tablet Take 1 tablet by mouth daily.     No current facility-administered medications on file prior to visit.    The PMH, PSH, Social History, Family History, Medications, and allergies have been reviewed in [facility], and have been updated if  relevant.  Review of Systems  Constitutional: Negative.   HENT: Negative.   Eyes: Negative.   Respiratory: Negative.   Cardiovascular: Negative.   Gastrointestinal: Negative.   Endocrine: Negative.   Genitourinary: Negative.   Musculoskeletal: Negative.   Skin: Negative.   Allergic/Immunologic: Negative.   Neurological: Negative.   Hematological: Negative.   Psychiatric/Behavioral: Negative.   All other systems reviewed and are negative.      Objective:   Physical Exam BP 108/74   Pulse 71   Temp 97.8 F (36.6 C) (Oral)   Ht 5' 4.75" (1.645 m)   Wt 141 lb 8 oz (64.2 kg)   SpO2 99%   BMI 23.73 kg/m   General:  Well-developed,well-nourished,in no acute distress; alert,appropriate and cooperative throughout examination Head:  normocephalic and atraumatic.   Eyes:  vision grossly intact, pupils equal, pupils round, and pupils reactive to light.   Ears:  R ear normal and L ear normal.   Nose:  no external deformity.   Lungs:  Normal respiratory effort, chest expands symmetrically.  Heart:  Normal rate and regular rhythm. S1 and S2 normal without gallop, murmur, click, rub or other extra sounds. Abdomen:  Bowel sounds positive,abdomen soft and non-tender without masses, organomegaly or hernias noted. Msk:  No deformity or  scoliosis noted of thoracic or lumbar spine.   Extremities:  No clubbing, cyanosis, edema, or deformity noted with normal full range of motion of all joints.   Neurologic:  alert & oriented X3 and gait normal.   Skin:  Intact without suspicious lesions or rashes Psych:  Cognition and judgment appear intact. Alert and cooperative with normal attention span and concentration. No apparent delusions, illusions, hallucinations    Assessment & Plan:

## 2016-10-01 ENCOUNTER — Encounter: Payer: Self-pay | Admitting: Family Medicine

## 2016-10-01 ENCOUNTER — Ambulatory Visit: Payer: BLUE CROSS/BLUE SHIELD | Admitting: Family Medicine

## 2016-10-01 ENCOUNTER — Ambulatory Visit (INDEPENDENT_AMBULATORY_CARE_PROVIDER_SITE_OTHER): Payer: BLUE CROSS/BLUE SHIELD | Admitting: Family Medicine

## 2016-10-01 VITALS — BP 126/88 | HR 89 | Temp 98.3°F | Ht 65.0 in | Wt 140.0 lb

## 2016-10-01 DIAGNOSIS — J0110 Acute frontal sinusitis, unspecified: Secondary | ICD-10-CM | POA: Diagnosis not present

## 2016-10-01 MED ORDER — HYDROCODONE-HOMATROPINE 5-1.5 MG/5ML PO SYRP: 5.0000 mL | ORAL_SOLUTION | Freq: Three times a day (TID) | ORAL | 0 refills | Status: DC | PRN

## 2016-10-01 MED ORDER — AMOXICILLIN-POT CLAVULANATE 875-125 MG PO TABS: 1.0000 | ORAL_TABLET | Freq: Two times a day (BID) | ORAL | 0 refills | Status: AC

## 2016-10-01 NOTE — Patient Instructions (Signed)
Take Augmentin as directed.  Drink lots of fluids.    Treat sympotmatically with Mucinex, nasal saline irrigation, and Tylenol/Ibuprofen.   Also try an antihistamine/decongestant like claritin D or zyrtec D over the counter- two times a day as needed ( have to sign for them at pharmacy).   Try over the counter nasocort-start with 2 sprays per nostril per day...and then try to taper to 1 spray per nostril once symptoms improve.   You can use warm compresses.  Cough suppressant at night.   Call if not improving as expected in 5-7 days.

## 2016-10-01 NOTE — Progress Notes (Signed)
SUBJECTIVE:  Anna Bauer is a 46 y.o. female who complains of coryza, congestion, productive cough and bilateral sinus pain for 9 days. She denies a history of anorexia and chest pain and denies a history of asthma. Patient denies smoke cigarettes.   Current Outpatient Prescriptions on File Prior to Visit  Medication Sig Dispense Refill  . fluocinonide-emollient (LIDEX-E) 0.05 % cream Apply 1 application topically 2 (two) times daily. 30 g 5  . norethindrone-ethinyl estradiol 1/35 (ORTHO-NOVUM, NORTREL,CYCLAFEM) tablet Take 1 tablet by mouth daily.     No current facility-administered medications on file prior to visit.     No Known Allergies  No past medical history on file.  Past Surgical History:  Procedure Laterality Date  . TONSILLECTOMY  1978    Family History  Problem Relation Age of Onset  . Heart disease Mother 40    MI  . Hypertension Father     Social History   Social History  . Marital status: Married    Spouse name: N/A  . Number of children: N/A  . Years of education: N/A   Occupational History  . Not on file.   Social History Main Topics  . Smoking status: Former Research scientist (life sciences)  . Smokeless tobacco: Never Used     Comment: Smoked for one year as a teenager  . Alcohol use Not on file  . Drug use: Unknown  . Sexual activity: Not on file   Other Topics Concern  . Not on file   Social History Narrative   Married.   One child- 38 year old son, lives in [place].   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   The PMH, PSH, Social History, Family History, Medications, and allergies have been reviewed in [facility], and have been updated if relevant.  OBJECTIVE: BP 126/88   Pulse 89   Temp 98.3 F (36.8 C)   Ht 5\' 5"  (1.651 m)   Wt 140 lb (63.5 kg)   SpO2 98%   BMI 23.30 kg/m   She appears well, vital signs are as noted. Ears normal.  Throat and pharynx normal.  Neck supple. No adenopathy in the neck. Nose is congested. non tender. The chest is clear, without wheezes or  rales.  ASSESSMENT:  sinusitis  PLAN: Given duration and progression of symptoms, will treat for bacterial sinusitis with Augmentin.  Symptomatic therapy suggested: push fluids, rest and return office visit prn if symptoms persist or worsen.Call or return to clinic prn if these symptoms worsen or fail to improve as anticipated.

## 2016-10-07 ENCOUNTER — Encounter: Payer: Self-pay | Admitting: Family Medicine

## 2016-10-20 ENCOUNTER — Telehealth: Payer: BLUE CROSS/BLUE SHIELD | Admitting: Family

## 2016-10-20 DIAGNOSIS — R399 Unspecified symptoms and signs involving the genitourinary system: Secondary | ICD-10-CM

## 2016-10-20 MED ORDER — NITROFURANTOIN MONOHYD MACRO 100 MG PO CAPS: 100.0000 mg | ORAL_CAPSULE | Freq: Two times a day (BID) | ORAL | 0 refills | Status: DC

## 2016-10-20 NOTE — Progress Notes (Signed)

## 2016-12-01 ENCOUNTER — Telehealth: Payer: BLUE CROSS/BLUE SHIELD | Admitting: Family

## 2016-12-01 DIAGNOSIS — N390 Urinary tract infection, site not specified: Secondary | ICD-10-CM

## 2016-12-01 MED ORDER — CEPHALEXIN 500 MG PO CAPS: 500.0000 mg | ORAL_CAPSULE | Freq: Two times a day (BID) | ORAL | 0 refills | Status: DC

## 2016-12-01 NOTE — Progress Notes (Signed)
We are sorry that you are not feeling well.  Here is how we plan to help!  Thank you for the details you put in the comment boxes. Those details really help [place] take better care of you.   Based on what you shared with me it looks like you most likely have a simple urinary tract infection.  A UTI (Urinary Tract Infection) is a bacterial infection of the bladder.  Most cases of urinary tract infections are simple to treat but a key part of your care is to encourage you to drink plenty of fluids and watch your symptoms carefully.  I have prescribed Keflex 500 mg twice a day for 7 days.  Your symptoms should gradually improve. Call us if the burning in your urine worsens, you develop worsening fever, back pain or pelvic pain or if your symptoms do not resolve after completing the antibiotic.  Urinary tract infections can be prevented by drinking plenty of water to keep your body hydrated.  Also be sure when you wipe, wipe from front to back and don't hold it in!  If possible, empty your bladder every 4 hours.  Your e-visit answers were reviewed by a board certified advanced clinical practitioner to complete your personal care plan.  Depending on the condition, your plan could have included both over the counter or prescription medications.  If there is a problem please reply  once you have received a response from your provider.  Your safety is important to [place].  If you have drug allergies check your prescription carefully.    You can use MyChart to ask questions about today's visit, request a non-urgent call back, or ask for a work or school excuse for 24 hours related to this e-Visit. If it has been greater than 24 hours you will need to follow up with your provider, or enter a new e-Visit to address those concerns.   You will get an e-mail in the next two days asking about your experience.  I hope that your e-visit has been valuable and will speed your recovery. Thank you for using  e-visits.

## 2017-05-13 DIAGNOSIS — Z01419 Encounter for gynecological examination (general) (routine) without abnormal findings: Secondary | ICD-10-CM | POA: Diagnosis not present

## 2017-05-13 DIAGNOSIS — Z6823 Body mass index (BMI) 23.0-23.9, adult: Secondary | ICD-10-CM | POA: Diagnosis not present

## 2017-05-13 DIAGNOSIS — Z1231 Encounter for screening mammogram for malignant neoplasm of breast: Secondary | ICD-10-CM | POA: Diagnosis not present

## 2017-05-13 LAB — HM PAP SMEAR: HM Pap smear: NEGATIVE

## 2017-11-10 DIAGNOSIS — K120 Recurrent oral aphthae: Secondary | ICD-10-CM | POA: Diagnosis not present

## 2017-11-10 DIAGNOSIS — H6980 Other specified disorders of Eustachian tube, unspecified ear: Secondary | ICD-10-CM | POA: Diagnosis not present

## 2017-11-19 DIAGNOSIS — D1801 Hemangioma of skin and subcutaneous tissue: Secondary | ICD-10-CM | POA: Diagnosis not present

## 2017-11-19 DIAGNOSIS — D2260 Melanocytic nevi of unspecified upper limb, including shoulder: Secondary | ICD-10-CM | POA: Diagnosis not present

## 2017-11-19 DIAGNOSIS — D225 Melanocytic nevi of trunk: Secondary | ICD-10-CM | POA: Diagnosis not present

## 2017-11-19 DIAGNOSIS — L309 Dermatitis, unspecified: Secondary | ICD-10-CM | POA: Diagnosis not present

## 2017-12-01 DIAGNOSIS — K120 Recurrent oral aphthae: Secondary | ICD-10-CM | POA: Diagnosis not present

## 2017-12-01 DIAGNOSIS — K1379 Other lesions of oral mucosa: Secondary | ICD-10-CM | POA: Diagnosis not present

## 2017-12-17 DIAGNOSIS — L309 Dermatitis, unspecified: Secondary | ICD-10-CM | POA: Diagnosis not present

## 2017-12-17 DIAGNOSIS — D485 Neoplasm of uncertain behavior of skin: Secondary | ICD-10-CM | POA: Diagnosis not present

## 2017-12-17 DIAGNOSIS — D2339 Other benign neoplasm of skin of other parts of face: Secondary | ICD-10-CM | POA: Diagnosis not present

## 2018-01-23 ENCOUNTER — Other Ambulatory Visit: Payer: Self-pay

## 2018-01-23 ENCOUNTER — Emergency Department
Admission: EM | Admit: 2018-01-23 | Discharge: 2018-01-23 | Disposition: A | Discharge status: Home / Self Care | Payer: BLUE CROSS/BLUE SHIELD | Attending: Emergency Medicine | Admitting: Emergency Medicine

## 2018-01-23 ENCOUNTER — Emergency Department: Payer: BLUE CROSS/BLUE SHIELD

## 2018-01-23 DIAGNOSIS — Y9302 Activity, running: Secondary | ICD-10-CM | POA: Insufficient documentation

## 2018-01-23 DIAGNOSIS — Z23 Encounter for immunization: Secondary | ICD-10-CM | POA: Insufficient documentation

## 2018-01-23 DIAGNOSIS — Y92838 Other recreation area as the place of occurrence of the external cause: Secondary | ICD-10-CM | POA: Insufficient documentation

## 2018-01-23 DIAGNOSIS — Z79899 Other long term (current) drug therapy: Secondary | ICD-10-CM | POA: Insufficient documentation

## 2018-01-23 DIAGNOSIS — Y998 Other external cause status: Secondary | ICD-10-CM | POA: Diagnosis not present

## 2018-01-23 DIAGNOSIS — Z87891 Personal history of nicotine dependence: Secondary | ICD-10-CM | POA: Insufficient documentation

## 2018-01-23 DIAGNOSIS — S01511A Laceration without foreign body of lip, initial encounter: Secondary | ICD-10-CM | POA: Diagnosis not present

## 2018-01-23 DIAGNOSIS — W01198A Fall on same level from slipping, tripping and stumbling with subsequent striking against other object, initial encounter: Secondary | ICD-10-CM | POA: Diagnosis not present

## 2018-01-23 MED ORDER — LIDOCAINE HCL 1 % IJ SOLN
5.0000 mL | Freq: Once | INTRAMUSCULAR | Status: AC
  Administered 2018-01-23: 5 mL
  Filled 2018-01-23: qty 5

## 2018-01-23 MED ORDER — CEPHALEXIN 500 MG PO CAPS: 500.0000 mg | ORAL_CAPSULE | Freq: Three times a day (TID) | ORAL | 0 refills | Status: DC

## 2018-01-23 MED ORDER — TETANUS-DIPHTH-ACELL PERTUSSIS 5-2.5-18.5 LF-MCG/0.5 IM SUSP
0.5000 mL | Freq: Once | INTRAMUSCULAR | Status: AC
  Administered 2018-01-23: 0.5 mL via INTRAMUSCULAR
  Filled 2018-01-23: qty 0.5

## 2018-01-23 MED ORDER — LIDOCAINE HCL (PF) 1 % IJ SOLN
INTRAMUSCULAR | Status: AC
  Administered 2018-01-23: 5 mL
  Filled 2018-01-23: qty 5

## 2018-01-23 MED ORDER — LIDOCAINE HCL (PF) 1 % IJ SOLN
INTRAMUSCULAR | Status: AC
  Filled 2018-01-23: qty 5

## 2018-01-23 NOTE — ED Provider Notes (Signed)
[facility] Emergency Department Provider Note  ____________________________________________  Time seen: Approximately 10:16 PM  I have reviewed the triage vital signs and the nursing notes.   HISTORY  Chief Complaint Laceration    HPI Anna Bauer is a 47 y.o. female presents to the emergency department with a right lower lip triangular-shaped  laceration after patient reportedly fell while running.  Patient is also complaining of pain at the superior aspect of the right orbit.  She did not lose consciousness when she fell.  She denies neck pain.  No weakness, radiculopathy or changes in sensation in the upper or lower extremities.  Patient reports that her tetanus status is out of date.   History reviewed. No pertinent past medical history.  Patient Active Problem List   Diagnosis Date Noted  . Well woman exam 04/17/2016    Past Surgical History:  Procedure Laterality Date  . INNER EAR SURGERY      Prior to Admission medications   Medication Sig Start Date End Date Taking? Authorizing Provider  amoxicillin (AMOXIL) 500 MG capsule  09/25/16   [provider]  cephALEXin (KEFLEX) 500 MG capsule Take 1 capsule (500 mg total) by mouth 3 (three) times daily for 10 days. 01/23/18 02/02/18  Lannie Fields, PA-C  fluocinonide-emollient (LIDEX-E) 0.05 % cream Apply 1 application topically 2 (two) times daily. 08/14/15   Lucille Passy, MD  HYDROcodone-homatropine [facility]) 5-1.5 MG/5ML syrup Take 5 mLs by mouth every 8 (eight) hours as needed for cough. 10/01/16   Lucille Passy, MD  nitrofurantoin, macrocrystal-monohydrate, (MACROBID) 100 MG capsule Take 1 capsule (100 mg total) by mouth 2 (two) times daily. 1 po BId 10/20/16   Evelina Dun A, FNP  norethindrone-ethinyl estradiol 1/35 (ORTHO-NOVUM, NORTREL,CYCLAFEM) tablet Take 1 tablet by mouth daily.    [provider]    Allergies Patient has no known allergies.  Family History   Problem Relation Age of Onset  . Heart disease Mother 59       MI  . Hypertension Father     Social History Social History   Tobacco Use  . Smoking status: Former Research scientist (life sciences)  . Smokeless tobacco: Never Used  . Tobacco comment: Smoked for one year as a teenager  Substance Use Topics  . Alcohol use: Yes  . Drug use: Not on file     Review of Systems  Constitutional: No fever/chills Eyes: No visual changes. No discharge ENT: No upper respiratory complaints. Cardiovascular: no chest pain. Respiratory: no cough. No SOB. Gastrointestinal: No abdominal pain.  No nausea, no vomiting.  No diarrhea.  No constipation. Genitourinary: Negative for dysuria. No hematuria Musculoskeletal: Negative for musculoskeletal pain. Skin: Patient has right sided lower lip laceration.  Neurological: Negative for headaches, focal weakness or numbness. .  ____________________________________________   PHYSICAL EXAM:  VITAL SIGNS: ED Triage Vitals  Enc Vitals Group     BP 01/23/18 1939 137/85     Pulse Rate 01/23/18 1939 84     Resp 01/23/18 1939 17     Temp 01/23/18 1939 98.4 F (36.9 C)     Temp Source 01/23/18 1939 Oral     SpO2 01/23/18 1939 98 %     Weight 01/23/18 1937 139 lb (63 kg)     Height 01/23/18 1937 5\' 5"  (1.651 m)     Head Circumference --      Peak Flow --      Pain Score 01/23/18 1937 4     Pain Loc --  Pain Edu? --      Excl. in [place]? --      Constitutional: Alert and oriented. Well appearing and in no acute distress. Eyes: Conjunctivae are normal. PERRL. EOMI. Head: Atraumatic. ENT:      Ears: TMs are pearly.      Nose: No congestion/rhinnorhea.      Mouth/Throat: Mucous membranes are moist.   Neck: No stridor.  No cervical spine tenderness to palpation. Hematological/Lymphatic/Immunilogical: No cervical lymphadenopathy.  Cardiovascular: Normal rate, regular rhythm. Normal S1 and S2.  Good peripheral circulation. Respiratory: Normal respiratory effort without  tachypnea or retractions. Lungs CTAB. Good air entry to the bases with no decreased or absent breath sounds. Gastrointestinal: Bowel sounds 4 quadrants. Soft and nontender to palpation. No guarding or rigidity. No palpable masses. No distention. No CVA tenderness. Musculoskeletal: Full range of motion to all extremities. No gross deformities appreciated. Neurologic:  Normal speech and language. No gross focal neurologic deficits are appreciated.  Skin: Patient has 1 cm, triangular-shaped laceration right lower lip. Psychiatric: Mood and affect are normal. Speech and behavior are normal. Patient exhibits appropriate insight and judgement.   ____________________________________________   LABS (all labs ordered are listed, but only abnormal results are displayed)  Labs Reviewed - No data to display ____________________________________________  EKG   ____________________________________________  RADIOLOGY I personally viewed and evaluated these images as part of my medical decision making, as well as reviewing the written report by the radiologist.  Ct Maxillofacial Wo Contrast  Result Date: 01/23/2018 CLINICAL DATA:  Pt was running on wooded trail and tripped over root and fell. +lac to R side of lower lip. Pt reports pain and swelling to R orbit and chin. Denies LOC EXAM: CT MAXILLOFACIAL WITHOUT CONTRAST TECHNIQUE: Multidetector CT imaging of the maxillofacial structures was performed. Multiplanar CT image reconstructions were also generated. COMPARISON:  Head CT, 04/25/2009 FINDINGS: Osseous: No fracture or mandibular dislocation. No destructive process. Orbits: Minor right preseptal periorbital soft tissue swelling. Normal right globe and postseptal orbit. Normal left globe and orbit. Sinuses: Clear. Soft tissues: Mild soft tissue swelling along the anterior inferior aspect of the mandible. Soft tissues otherwise unremarkable. Limited intracranial: Normal. IMPRESSION: 1. No fracture. 2.  Minor right periorbital soft tissue swelling. No injury to the right globe or postseptal orbit. Mild anterior chin subcutaneous soft tissue swelling. No other abnormalities. Electronically Signed   By: Lajean Manes M.D.   On: 01/23/2018 21:37    ____________________________________________    PROCEDURES  Procedure(s) performed:    Procedures  LACERATION REPAIR Performed by: Lannie Fields Authorized by: Lannie Fields Consent: Verbal consent obtained. Risks and benefits: risks, benefits and alternatives were discussed Consent given by: patient Patient identity confirmed: provided demographic data Prepped and Draped in normal sterile fashion Wound explored  Laceration Location: Right lower lip   Laceration Length: 1 cm  No Foreign Bodies seen or palpated  Anesthesia: local infiltration  Local anesthetic: lidocaine 1% without epinephrine  Anesthetic total: 2 ml  Irrigation method: syringe Amount of cleaning: standard  Skin closure: 6-0 Ethilon   Number of sutures: 4  Technique: Simple Interrupted   Patient tolerance: Patient tolerated the procedure well with no immediate complications.   Medications  lidocaine (XYLOCAINE) 1 % (with pres) injection 5 mL (5 mLs Infiltration Given 01/23/18 2142)  Tdap (BOOSTRIX) injection 0.5 mL (0.5 mLs Intramuscular Given 01/23/18 2142)     ____________________________________________   INITIAL IMPRESSION / ASSESSMENT AND PLAN / ED COURSE  Pertinent labs & imaging  results that were available during my care of the patient were reviewed by me and considered in my medical decision making (see chart for details).  Review of the [place] CSRS was performed in accordance of the [place] prior to dispensing any controlled drugs.     Assessment and plan Lip laceration Patient presents to the emergency department with a right lower lip laceration repaired in the emergency department without complication.  Patient was advised to have sutures  removed by primary care in 5 days.  She was discharged with Keflex.  All patient questions were answered.     ____________________________________________  FINAL CLINICAL IMPRESSION(S) / ED DIAGNOSES  Final diagnoses:  Lip laceration, initial encounter      NEW MEDICATIONS STARTED DURING THIS VISIT:  ED Discharge Orders        Ordered    cephALEXin (KEFLEX) 500 MG capsule  3 times daily     01/23/18 2210          This chart was dictated using voice recognition software/Dragon. Despite best efforts to proofread, errors can occur which can change the meaning. Any change was purely unintentional.    Lannie Fields, PA-C 01/23/18 2224    Delman Kitten, MD 01/24/18 0040

## 2018-01-23 NOTE — ED Triage Notes (Signed)
Patient c/o laceration to right, lower lip post fall while running. Patient denies LOC, dizziness, N/V.

## 2018-01-23 NOTE — ED Notes (Signed)
Pt was running on wooded trail and tripped over root and fell. +lac to R side of lower lip. Pt reports pain and swelling to R orbit and chin. Denies LOC. +abrasions to bilat upper thighs. Unsure when last tetanus was.

## 2018-01-28 ENCOUNTER — Ambulatory Visit: Payer: BLUE CROSS/BLUE SHIELD | Admitting: Internal Medicine

## 2018-01-28 ENCOUNTER — Encounter: Payer: Self-pay | Admitting: Internal Medicine

## 2018-01-28 VITALS — BP 118/70 | HR 76 | Temp 98.1°F | Wt 141.0 lb

## 2018-01-28 DIAGNOSIS — W19XXXD Unspecified fall, subsequent encounter: Secondary | ICD-10-CM

## 2018-01-28 DIAGNOSIS — S01511D Laceration without foreign body of lip, subsequent encounter: Secondary | ICD-10-CM | POA: Diagnosis not present

## 2018-01-28 DIAGNOSIS — Z4802 Encounter for removal of sutures: Secondary | ICD-10-CM | POA: Diagnosis not present

## 2018-01-28 NOTE — Patient Instructions (Signed)
Suture Removal, Care After Refer to this sheet in the next few weeks. These instructions provide you with information on caring for yourself after your procedure. Your health care provider may also give you more specific instructions. Your treatment has been planned according to current medical practices, but problems sometimes occur. Call your health care provider if you have any problems or questions after your procedure. What can I expect after the procedure? After your stitches (sutures) are removed, it is typical to have the following:  Some discomfort and swelling in the wound area.  Slight redness in the area.  Follow these instructions at home:  If you have skin adhesive strips over the wound area, do not take the strips off. They will fall off on their own in a few days. If the strips remain in place after 14 days, you may remove them.  Change any bandages (dressings) at least once a day or as directed by your health care provider. If the bandage sticks, soak it off with warm, soapy water.  Apply cream or ointment only as directed by your health care provider. If using cream or ointment, wash the area with soap and water 2 times a day to remove all the cream or ointment. Rinse off the soap and pat the area dry with a clean towel.  Keep the wound area dry and clean. If the bandage becomes wet or dirty, or if it develops a bad smell, change it as soon as possible.  Continue to protect the wound from injury.  Use sunscreen when out in the sun. New scars become sunburned easily. Contact a health care provider if:  You have increasing redness, swelling, or pain in the wound.  You see pus coming from the wound.  You have a fever.  You notice a bad smell coming from the wound or dressing.  Your wound breaks open (edges not staying together). This information is not intended to replace advice given to you by your health care provider. Make sure you discuss any questions you have  with your health care provider. Document Released: 03/25/2001 Document Revised: 12/06/2015 Document Reviewed: 02/09/2013 Elsevier Interactive Patient Education  2017 Elsevier Inc.  

## 2018-01-28 NOTE — Progress Notes (Signed)
Subjective:    Patient ID: Anna Bauer, female    DOB: January 27, 1971, 47 y.o.   MRN: [id number]  HPI  Pt presents to the clinic today for ER follow up. She went to the ER 7/13 after she fell while running. She hit her lip on a root, causing a laceration. She had pain and swelling to right orbit. CT scan of face was negative for fracture. She received 4 sutures to her lip that need removed today. She was placed on Keflex x 10 days. She denies any complications with sutures or antibiotics.  Review of Systems      No past medical history on file.  Current Outpatient Medications  Medication Sig Dispense Refill  . cephALEXin (KEFLEX) 500 MG capsule Take 500 mg by mouth 3 (three) times daily.    . fluticasone (FLONASE) 50 MCG/ACT nasal spray Place into both nostrils daily.    Marland Kitchen loratadine (CLARITIN) 10 MG tablet Take 10 mg by mouth daily.    . norethindrone-ethinyl estradiol 1/35 (ORTHO-NOVUM, NORTREL,CYCLAFEM) tablet Take 1 tablet by mouth daily.    Marland Kitchen triamcinolone (KENALOG) 0.1 % paste APPLY SMALL AMOUNT TO AREA 3 TIMES DAILY FOR 14 DAYS AS NEEDED FOR ULCER ON TONGUE  3   No current facility-administered medications for this visit.     No Known Allergies  Family History  Problem Relation Age of Onset  . Heart disease Mother 31       MI  . Hypertension Father     Social History   Socioeconomic History  . Marital status: Married    Spouse name: Not on file  . Number of children: Not on file  . Years of education: Not on file  . Highest education level: Not on file  Occupational History  . Not on file  Social Needs  . Financial resource strain: Not on file  . Food insecurity:    Worry: Not on file    Inability: Not on file  . Transportation needs:    Medical: Not on file    Non-medical: Not on file  Tobacco Use  . Smoking status: Former Research scientist (life sciences)  . Smokeless tobacco: Never Used  . Tobacco comment: Smoked for one year as a teenager  Substance and Sexual Activity  .  Alcohol use: Yes  . Drug use: Not on file  . Sexual activity: Not on file  Lifestyle  . Physical activity:    Days per week: Not on file    Minutes per session: Not on file  . Stress: Not on file  Relationships  . Social connections:    Talks on phone: Not on file    Gets together: Not on file    Attends religious service: Not on file    Active member of club or organization: Not on file    Attends meetings of clubs or organizations: Not on file    Relationship status: Not on file  . Intimate partner violence:    Fear of current or ex partner: Not on file    Emotionally abused: Not on file    Physically abused: Not on file    Forced sexual activity: Not on file  Other Topics Concern  . Not on file  Social History Narrative   Married.   One child- 13 year old son, lives in [place].   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     Constitutional: Denies fever, malaise, fatigue, headache or abrupt weight changes.  Respiratory: Denies difficulty breathing, shortness of breath, cough or  sputum production.   Cardiovascular: Denies chest pain, chest tightness, palpitations or swelling in the hands or feet.  Skin: Pt reports laceration to lip. Denies redness, rashes, lesions or ulcercations.    No other specific complaints in a complete review of systems (except as listed in HPI above).  Objective:   Physical Exam   BP 118/70   Pulse 76   Temp 98.1 F (36.7 C) (Oral)   Wt 141 lb (64 kg)   SpO2 98%   BMI 23.46 kg/m  Wt Readings from Last 3 Encounters:  01/28/18 141 lb (64 kg)  01/23/18 139 lb (63 kg)  10/01/16 140 lb (63.5 kg)    General: Appears her stated age, well developed, well nourished in NAD. Skin: Laceration to right lower lip. Wound edges intact. No drainage noted.  Neurological: Alert and oriented.    BMET    Component Value Date/Time   NA 137 04/18/2016 0824   K 4.8 04/18/2016 0824   CL 101 04/18/2016 0824   CO2 22 04/18/2016 0824   GLUCOSE 82 04/18/2016 0824    BUN 12 04/18/2016 0824   CREATININE 0.87 04/18/2016 0824   CALCIUM 9.3 04/18/2016 0824   GFRNONAA 81 04/18/2016 0824   GFRAA 93 04/18/2016 0824    Lipid Panel     Component Value Date/Time   CHOL 164 04/18/2016 0824   TRIG 70 04/18/2016 0824   HDL 61 04/18/2016 0824   CHOLHDL 2.7 04/18/2016 0824   LDLCALC 89 04/18/2016 0824    CBC    Component Value Date/Time   WBC 4.9 04/18/2016 0824   RBC 4.21 04/18/2016 0824   HGB 13.5 04/18/2016 0824   HCT 41.8 04/18/2016 0824   PLT 282 04/18/2016 0824   MCV 99 (H) 04/18/2016 0824   MCH 32.1 04/18/2016 0824   MCHC 32.3 04/18/2016 0824   RDW 12.7 04/18/2016 0824   LYMPHSABS 1.7 04/18/2016 0824   EOSABS 0.2 04/18/2016 0824   BASOSABS 0.0 04/18/2016 0824    Hgb A1C No results found for: HGBA1C         Assessment & Plan:   ER Follow Up for Fall, Lip Laceration, Suture Removal:  ER notes and imaging reviewed Continue Keflex as prescribed Vit E over incision to help reduce scarring Sutures removed today, no complications, pt tolerated well After care instructions given  Return precautions discussed Webb Silversmith, NP

## 2018-03-25 DIAGNOSIS — M25461 Effusion, right knee: Secondary | ICD-10-CM | POA: Diagnosis not present

## 2018-03-25 DIAGNOSIS — M25561 Pain in right knee: Secondary | ICD-10-CM | POA: Diagnosis not present

## 2018-06-22 DIAGNOSIS — Z6824 Body mass index (BMI) 24.0-24.9, adult: Secondary | ICD-10-CM | POA: Diagnosis not present

## 2018-06-22 DIAGNOSIS — Z01419 Encounter for gynecological examination (general) (routine) without abnormal findings: Secondary | ICD-10-CM | POA: Diagnosis not present

## 2018-06-22 DIAGNOSIS — Z1231 Encounter for screening mammogram for malignant neoplasm of breast: Secondary | ICD-10-CM | POA: Diagnosis not present

## 2018-06-22 LAB — HM MAMMOGRAPHY

## 2019-05-05 ENCOUNTER — Telehealth: Payer: Self-pay | Admitting: Family Medicine

## 2019-05-05 ENCOUNTER — Telehealth: Payer: Self-pay

## 2019-05-05 NOTE — Telephone Encounter (Addendum)
I called and left message on patient voicemail that Dr. Deborra Medina is not currently accepting [facility] at this time as she will be leaving the practice in January. Patient may contact the office to let us know if she wants to [facility] to another provider, however we will need to know who patient would like to see if she still wants to transfer to [facility]

## 2019-05-05 NOTE — Telephone Encounter (Signed)
I don't see why Dr. Einar Pheasant would mind, she will return from maternity leave next week. Please schedule patient for transfer care appointment.

## 2019-05-05 NOTE — Telephone Encounter (Signed)
Yes okay with me.

## 2019-05-05 NOTE — Telephone Encounter (Signed)
Patient is requesting a TOC from Dr Deborra Medina to Dr Einar Pheasant.  Due to being closer to the [place] location.    Is this TOC okay?

## 2019-05-05 NOTE — Telephone Encounter (Signed)
Copied from [place] [id number]. Topic: Appointment Scheduling - Prior Auth Required for Appointment >> May 05, 2019 10:22 AM Robina Ade, Helene Kelp D wrote: No appointment has been scheduled. Patient is requesting transfer of care appointment from [facility] to [place]. Per scheduling protocol, this appointment requires a prior authorization prior to scheduling.  Route to department's PEC pool.

## 2019-05-11 ENCOUNTER — Telehealth: Payer: Self-pay

## 2019-05-11 NOTE — Telephone Encounter (Signed)
Copied from [place] [phone]. Topic: Appointment Scheduling - Prior Auth Required for Appointment >> May 05, 2019 10:22 AM Robina Ade, Helene Kelp D wrote: No appointment has been scheduled. Patient is requesting transfer of care appointment from [facility] to [place]. Per scheduling protocol, this appointment requires a prior authorization prior to scheduling.  Route to department's PEC pool.

## 2019-05-31 ENCOUNTER — Ambulatory Visit (INDEPENDENT_AMBULATORY_CARE_PROVIDER_SITE_OTHER): Payer: 59 | Admitting: Family Medicine

## 2019-05-31 ENCOUNTER — Other Ambulatory Visit: Payer: Self-pay

## 2019-05-31 ENCOUNTER — Encounter: Payer: Self-pay | Admitting: Family Medicine

## 2019-05-31 VITALS — BP 138/98 | HR 74 | Temp 98.2°F | Resp 12 | Ht 64.5 in | Wt 146.2 lb

## 2019-05-31 DIAGNOSIS — R030 Elevated blood-pressure reading, without diagnosis of hypertension: Secondary | ICD-10-CM | POA: Insufficient documentation

## 2019-05-31 DIAGNOSIS — Z0000 Encounter for general adult medical examination without abnormal findings: Secondary | ICD-10-CM | POA: Diagnosis not present

## 2019-05-31 DIAGNOSIS — Z23 Encounter for immunization: Secondary | ICD-10-CM | POA: Diagnosis not present

## 2019-05-31 NOTE — Patient Instructions (Addendum)
Your blood pressure high. Monitor at home and follow-up via MyChart if low.   If high schedule an office visit to recheck in 3 months  High blood pressure increases your risk for heart attack and stroke.    Please check your blood pressure 2-4 times a week.   To check your blood pressure 1) Sit in a quiet and relaxed place for 5 minutes 2) Make sure your feet are flat on the ground 3) Consider checking first thing in the morning   Normal blood pressure is less than 140/90 Ideally you blood pressure should be around 120/80      Preventive Care 93-38 Years Old, Female Preventive care refers to visits with your health care provider and lifestyle choices that can promote health and wellness. This includes:  A yearly physical exam. This may also be called an annual well check.  Regular dental visits and eye exams.  Immunizations.  Screening for certain conditions.  Healthy lifestyle choices, such as eating a healthy diet, getting regular exercise, not using drugs or products that contain nicotine and tobacco, and limiting alcohol use. What can I expect for my preventive care visit? Physical exam Your health care provider will check your:  Height and weight. This may be used to calculate body mass index (BMI), which tells if you are at a healthy weight.  Heart rate and blood pressure.  Skin for abnormal spots. Counseling Your health care provider may ask you questions about your:  Alcohol, tobacco, and drug use.  Emotional well-being.  Home and relationship well-being.  Sexual activity.  Eating habits.  Work and work Statistician.  Method of birth control.  Menstrual cycle.  Pregnancy history. What immunizations do I need?  Influenza (flu) vaccine  This is recommended every year. Tetanus, diphtheria, and pertussis (Tdap) vaccine  You may need a Td booster every 10 years. Varicella (chickenpox) vaccine  You may need this if you have not been vaccinated.  Zoster (shingles) vaccine  You may need this after age 55. Measles, mumps, and rubella (MMR) vaccine  You may need at least one dose of MMR if you were born in 1957 or later. You may also need a second dose. Pneumococcal conjugate (PCV13) vaccine  You may need this if you have certain conditions and were not previously vaccinated. Pneumococcal polysaccharide (PPSV23) vaccine  You may need one or two doses if you smoke cigarettes or if you have certain conditions. Meningococcal conjugate (MenACWY) vaccine  You may need this if you have certain conditions. Hepatitis A vaccine  You may need this if you have certain conditions or if you travel or work in places where you may be exposed to hepatitis A. Hepatitis B vaccine  You may need this if you have certain conditions or if you travel or work in places where you may be exposed to hepatitis B. Haemophilus influenzae type b (Hib) vaccine  You may need this if you have certain conditions. Human papillomavirus (HPV) vaccine  If recommended by your health care provider, you may need three doses over 6 months. You may receive vaccines as individual doses or as more than one vaccine together in one shot (combination vaccines). Talk with your health care provider about the risks and benefits of combination vaccines. What tests do I need? Blood tests  Lipid and cholesterol levels. These may be checked every 5 years, or more frequently if you are over 4 years old.  Hepatitis C test.  Hepatitis B test. Screening  Lung cancer  screening. You may have this screening every year starting at age 12 if you have a 30-pack-year history of smoking and currently smoke or have quit within the past 15 years.  Colorectal cancer screening. All adults should have this screening starting at age 15 and continuing until age 52. Your health care provider may recommend screening at age 42 if you are at increased risk. You will have tests every 1-10 years,  depending on your results and the type of screening test.  Diabetes screening. This is done by checking your blood sugar (glucose) after you have not eaten for a while (fasting). You may have this done every 1-3 years.  Mammogram. This may be done every 1-2 years. Talk with your health care provider about when you should start having regular mammograms. This may depend on whether you have a family history of breast cancer.  BRCA-related cancer screening. This may be done if you have a family history of breast, ovarian, tubal, or peritoneal cancers.  Pelvic exam and Pap test. This may be done every 3 years starting at age 56. Starting at age 70, this may be done every 5 years if you have a Pap test in combination with an HPV test. Other tests  Sexually transmitted disease (STD) testing.  Bone density scan. This is done to screen for osteoporosis. You may have this scan if you are at high risk for osteoporosis. Follow these instructions at home: Eating and drinking  Eat a diet that includes fresh fruits and vegetables, whole grains, lean protein, and low-fat dairy.  Take vitamin and mineral supplements as recommended by your health care provider.  Do not drink alcohol if: ? Your health care provider tells you not to drink. ? You are pregnant, may be pregnant, or are planning to become pregnant.  If you drink alcohol: ? Limit how much you have to 0-1 drink a day. ? Be aware of how much alcohol is in your drink. In the [place] one drink equals one 12 oz bottle of beer (355 mL), one 5 oz glass of wine (148 mL), or one 1 oz glass of hard liquor (44 mL). Lifestyle  Take daily care of your teeth and gums.  Stay active. Exercise for at least 30 minutes on 5 or more days each week.  Do not use any products that contain nicotine or tobacco, such as cigarettes, e-cigarettes, and chewing tobacco. If you need help quitting, ask your health care provider.  If you are sexually active, practice  safe sex. Use a condom or other form of birth control (contraception) in order to prevent pregnancy and STIs (sexually transmitted infections).  If told by your health care provider, take low-dose aspirin daily starting at age 3. What's next?  Visit your health care provider once a year for a well check visit.  Ask your health care provider how often you should have your eyes and teeth checked.  Stay up to date on all vaccines. This information is not intended to replace advice given to you by your health care provider. Make sure you discuss any questions you have with your health care provider. Document Released: 07/27/2015 Document Revised: 03/11/2018 Document Reviewed: 03/11/2018 Elsevier Patient Education  2020 [employer].

## 2019-05-31 NOTE — Progress Notes (Signed)
Annual Exam   Chief Complaint:  Chief Complaint  Patient presents with  . Transfer of Care    from Dr. Deborra Medina    History of Present Illness:  Ms. Anna Bauer is a 48 y.o. No obstetric history on file. who LMP was No LMP recorded. (Menstrual status: Oral contraceptives)., presents today for her annual examination.     Nutrition She does get adequate calcium and Vitamin D in her diet. Diet: good mix clean eating Exercise: regular exercise  Safety The patient wears seatbelts: yes.     The patient feels safe at home and in their relationships: yes.   Menstrual Continuous birth control - no menses   GYN She is single partner, contraception - OCP (estrogen/progesterone).    Cervical Cancer Screening:   Last Pap:  Gets these through OB/GYN  Breast Cancer Screening There is no FH of breast cancer. There is no FH of ovarian cancer. BRCA screening Not Indicated.  Discussed that for average risk women between age 65-49 screening may reduce the risk of breast cancer death, however, at a lower rate than those over age 67. And that the the false-positive rates resulting in unnecessary biopsies with more screening is higher. The balance of benefits vs harms likely improves as you progress through your 40s. The patient does want a mammogram this year.   Weight Wt Readings from Last 3 Encounters:  05/31/19 146 lb 4 oz (66.3 kg)  01/28/18 141 lb (64 kg)  01/23/18 139 lb (63 kg)   Patient has normal BMI  BMI Readings from Last 1 Encounters:  05/31/19 24.72 kg/m     Chronic disease screening Blood pressure monitoring:  BP Readings from Last 3 Encounters:  05/31/19 (!) 138/98  01/28/18 118/70  01/23/18 134/85    Lipid Monitoring: Indication for screening: age >20, obesity, diabetes, family hx, CV risk factors.  Lipid screening: Yes  Lab Results  Component Value Date   CHOL 164 04/18/2016   HDL 61 04/18/2016   LDLCALC 89 04/18/2016   TRIG 70 04/18/2016   CHOLHDL 2.7  04/18/2016   More recent Total 166 HDL 57 LDL 96 TG 64  Diabetes Screening: age >43, overweight, family hx, PCOS, hx of gestational diabetes, at risk ethnicity Diabetes Screening screening: Not Indicated  No results found for: HGBA1C   Glucose at work - 50   Past Medical History:  Diagnosis Date  . BCC (basal cell carcinoma)     Past Surgical History:  Procedure Laterality Date  . INNER EAR SURGERY    . MOHS SURGERY     around 2000    Prior to Admission medications   Medication Sig Start Date End Date Taking? Authorizing Provider  fluticasone (FLONASE) 50 MCG/ACT nasal spray Place into both nostrils daily.   Yes [provider]  loratadine (CLARITIN) 10 MG tablet Take 10 mg by mouth daily.   Yes [provider]  norethindrone-ethinyl estradiol 1/35 (ORTHO-NOVUM, NORTREL,CYCLAFEM) tablet Take 1 tablet by mouth daily.   Yes [provider]  triamcinolone (KENALOG) 0.1 % paste APPLY SMALL AMOUNT TO AREA 3 TIMES DAILY FOR 14 DAYS AS NEEDED FOR ULCER ON TONGUE 11/10/17  Yes [provider]    No Known Allergies  Gynecologic History: No LMP recorded. (Menstrual status: Oral contraceptives).  Obstetric History: No obstetric history on file.  Social History   Socioeconomic History  . Marital status: Married    Spouse name: Eddie Dibbles  . Number of children: 1  . Years of education: some  college  . Highest education level: Not on file  Occupational History  . Not on file  Social Needs  . Financial resource strain: Not hard at all  . Food insecurity    Worry: Not on file    Inability: Not on file  . Transportation needs    Medical: Not on file    Non-medical: Not on file  Tobacco Use  . Smoking status: Never Smoker  . Smokeless tobacco: Never Used  Substance and Sexual Activity  . Alcohol use: Yes    Comment: wine-1 glass a day  . Drug use: Never  . Sexual activity: Yes    Birth control/protection: Pill  Lifestyle  . Physical  activity    Days per week: Not on file    Minutes per session: Not on file  . Stress: Not on file  Relationships  . Social connections    Talks on phone: Not on file    Gets together: Not on file    Attends religious service: Not on file    Active member of club or organization: Not on file    Attends meetings of clubs or organizations: Not on file    Relationship status: Not on file  . Intimate partner violence    Fear of current or ex partner: Not on file    Emotionally abused: Not on file    Physically abused: Not on file    Forced sexual activity: Not on file  Other Topics Concern  . Not on file  Social History Narrative   05/31/19   From: the area   Living: with Paul, Husband   Work: [employer]   Pets: one cat      Family: Son - Joey adult, lives in [place] (has a partner)      Enjoys: hiking, exercise, running      Exercise: gym - exercises 5-6 days a week - strength training and running   Diet: tries to eat clean, avoiding processed foods, simple meals      Safety   Seat belts: Yes    Guns: Yes  and secure   Safe in relationships: Yes     Family History  Problem Relation Age of Onset  . Endometrial cancer Mother   . Hypertension Father   . Heart attack Father 50  . Lung cancer Father        tobacco  . Kidney disease Father   . Hypertension Brother   . Diabetes Maternal Grandfather   . Arthritis Paternal Grandfather   . Lung cancer Paternal Aunt     Review of Systems  Constitutional: Negative for chills and fever.  HENT: Negative.   Eyes: Negative.   Respiratory: Negative.   Cardiovascular: Negative.   Gastrointestinal: Negative.   Genitourinary: Negative.   Musculoskeletal: Negative.   Skin: Negative.   Neurological: Negative.   Endo/Heme/Allergies: Negative.   Psychiatric/Behavioral: Negative.      Physical Exam BP (!) 138/98   Pulse 74   Temp 98.2 F (36.8 C)   Resp 12   Ht 5' 4.5" (1.638 m)   Wt 146 lb 4 oz (66.3 kg)   SpO2 100%   BMI  24.72 kg/m    BP Readings from Last 3 Encounters:  05/31/19 (!) 138/98  01/28/18 118/70  01/23/18 134/85      Physical Exam Constitutional:      General: She is not in acute distress.    Appearance: She is well-developed. She is not diaphoretic.  HENT:       Head: Normocephalic and atraumatic.     Right Ear: External ear normal.     Left Ear: External ear normal.     Nose: Nose normal.  Eyes:     General: No scleral icterus.    Conjunctiva/sclera: Conjunctivae normal.  Neck:     Musculoskeletal: Neck supple.  Cardiovascular:     Rate and Rhythm: Normal rate and regular rhythm.     Heart sounds: No murmur.  Pulmonary:     Effort: Pulmonary effort is normal. No respiratory distress.     Breath sounds: Normal breath sounds. No wheezing.  Abdominal:     General: Bowel sounds are normal. There is no distension.     Palpations: Abdomen is soft. There is no mass.     Tenderness: There is no abdominal tenderness. There is no guarding or rebound.  Musculoskeletal: Normal range of motion.  Lymphadenopathy:     Cervical: No cervical adenopathy.  Skin:    General: Skin is warm and dry.     Capillary Refill: Capillary refill takes less than 2 seconds.  Neurological:     Mental Status: She is alert and oriented to person, place, and time.     Deep Tendon Reflexes: Reflexes normal.  Psychiatric:        Behavior: Behavior normal.        Results:  PHQ-9: normal     Assessment: 48 y.o. No obstetric history on file. female here for routine annual physical examination.  Plan: Problem List Items Addressed This Visit      Other   Elevated blood pressure reading in office without diagnosis of hypertension    BP elevated today in the office. No prior. Discussed home monitoring and that if still elevated may want to discuss alternative birth control with GYN due to estrogen increasing BP slightly. F/u via mychart, if elevated will do 3 month f/u in office w/ labs        Other Visit Diagnoses    Annual physical exam    -  Primary   Need for influenza vaccination       Relevant Orders   Flu Vaccine QUAD 6+ mos PF IM (Fluarix Quad PF) (Completed)      Screening: -- Blood pressure screen elevated: continued to monitor. see plan above -- cholesterol screening: pt got with work and wnl -- Weight screening: normal -- Diabetes Screening: normal glucose with work testing -- Nutrition: normal   The ASCVD Risk score Mikey Bussing DC Jr., et al., 2013) failed to calculate for the following reasons:   Cannot find a previous HDL lab   Cannot find a previous total cholesterol lab  -- Statin therapy for Age 32-75 with CVD risk >7.5%  Psych -- Depression screening (PHQ-9):  low risk   Safety -- tobacco screening: not using -- alcohol screening:  low-risk usage. -- no evidence of domestic violence or intimate partner violence.   Cancer Screening -- pap smear up to date with GYN per ASCCP guidelines -- family history of breast cancer screening: done. not at high risk. -- Mammogram - gets through GYN, up to date  Immunizations -- flu vaccine up to date -- TDAP q10 years up to date  Encouraged to go to the dentist, and maintain healthy eating and exercise    Lesleigh Noe, MD

## 2019-05-31 NOTE — Assessment & Plan Note (Signed)
BP elevated today in the office. No prior. Discussed home monitoring and that if still elevated may want to discuss alternative birth control with GYN due to estrogen increasing BP slightly. F/u via mychart, if elevated will do 3 month f/u in office w/ labs

## 2019-06-06 ENCOUNTER — Encounter: Payer: Self-pay | Admitting: Family Medicine

## 2019-06-06 NOTE — Telephone Encounter (Signed)
Do you want patient to come in for this?

## 2019-06-08 ENCOUNTER — Ambulatory Visit (INDEPENDENT_AMBULATORY_CARE_PROVIDER_SITE_OTHER): Payer: 59 | Admitting: Family Medicine

## 2019-06-08 ENCOUNTER — Other Ambulatory Visit: Payer: Self-pay

## 2019-06-08 ENCOUNTER — Encounter: Payer: Self-pay | Admitting: Family Medicine

## 2019-06-08 VITALS — BP 108/62 | HR 73 | Temp 98.0°F | Ht 64.5 in | Wt 144.0 lb

## 2019-06-08 DIAGNOSIS — R0789 Other chest pain: Secondary | ICD-10-CM | POA: Diagnosis not present

## 2019-06-08 LAB — COMPREHENSIVE METABOLIC PANEL
ALT: 12 U/L (ref 0–35)
AST: 21 U/L (ref 0–37)
Albumin: 4.1 g/dL (ref 3.5–5.2)
Alkaline Phosphatase: 40 U/L (ref 39–117)
BUN: 8 mg/dL (ref 6–23)
CO2: 26 mEq/L (ref 19–32)
Calcium: 9.5 mg/dL (ref 8.4–10.5)
Chloride: 104 mEq/L (ref 96–112)
Creatinine, Ser: 0.8 mg/dL (ref 0.40–1.20)
GFR: 76.43 mL/min (ref >60.00)
Glucose, Bld: 78 mg/dL (ref 70–99)
Potassium: 4.3 mEq/L (ref 3.5–5.1)
Sodium: 138 mEq/L (ref 135–145)
Total Bilirubin: 0.6 mg/dL (ref 0.2–1.2)
Total Protein: 7.9 g/dL (ref 6.0–8.3)

## 2019-06-08 LAB — CBC
HCT: 42.9 % (ref 36.0–46.0)
Hemoglobin: 14.4 g/dL (ref 12.0–15.0)
MCHC: 33.5 g/dL (ref 30.0–36.0)
MCV: 97 fl (ref 78.0–100.0)
Platelets: 310 10*3/uL (ref 150.0–400.0)
RBC: 4.42 Mil/uL (ref 3.87–5.11)
RDW: 12.9 % (ref 11.5–15.5)
WBC: 4.8 10*3/uL (ref 4.0–10.5)

## 2019-06-08 NOTE — Assessment & Plan Note (Signed)
BP improved today from prior. Etiology unclear - reflux, msk, cardiac (given family hx). Pt with few risk factors (OCPs) but pain intermittent and improved with position changes seems more consistent with MSK related. Occurring at night could be related to reflux. Discussed monitoring - Tums prn. And to reach out if she would like to see cardiology to consider further testing.

## 2019-06-08 NOTE — Progress Notes (Signed)
Subjective:     Anna Bauer is a 48 y.o. female presenting for F/U BP and EKG (Has been checking BP at home. Has been normal most of the time. Another episode of chest pain.)     Chest Pain  This is a recurrent problem. The current episode started more than 1 month ago. The onset quality is sudden. The problem occurs rarely. The problem has been unchanged. The pain is present in the lateral region. The pain is at a severity of 6/10. The quality of the pain is described as stabbing. The pain does not radiate. Pertinent negatives include no abdominal pain, back pain, cough, diaphoresis, exertional chest pressure, headaches, irregular heartbeat, nausea, numbness, palpitations, shortness of breath, vomiting or weakness. Associated with: wakes her from sleep. Treatments tried: changing position. The treatment provided moderate relief. Risk factors include oral contraceptive use.  Her family medical history is significant for early MI.   Rolling to the right side improved the pain. When she lays on the left side it came back  Review of Systems  Constitutional: Negative for diaphoresis.  Respiratory: Negative for cough and shortness of breath.   Cardiovascular: Positive for chest pain. Negative for palpitations.  Gastrointestinal: Negative for abdominal pain, nausea and vomiting.  Musculoskeletal: Negative for back pain.  Neurological: Negative for weakness, numbness and headaches.     Social History   Tobacco Use  Smoking Status Never Smoker  Smokeless Tobacco Never Used        Objective:    BP Readings from Last 3 Encounters:  06/08/19 108/62  05/31/19 (!) 138/98  01/28/18 118/70   Wt Readings from Last 3 Encounters:  06/08/19 144 lb (65.3 kg)  05/31/19 146 lb 4 oz (66.3 kg)  01/28/18 141 lb (64 kg)    BP 108/62 (BP Location: Left Arm, Patient Position: Sitting, Cuff Size: Normal)   Pulse 73   Temp 98 F (36.7 C)   Ht 5' 4.5" (1.638 m)   Wt 144 lb (65.3 kg)    SpO2 98%   BMI 24.34 kg/m    Physical Exam Constitutional:      General: She is not in acute distress.    Appearance: She is well-developed. She is not diaphoretic.  HENT:     Right Ear: External ear normal.     Left Ear: External ear normal.     Nose: Nose normal.  Eyes:     Conjunctiva/sclera: Conjunctivae normal.  Neck:     Musculoskeletal: Normal range of motion and neck supple.  Cardiovascular:     Rate and Rhythm: Normal rate and regular rhythm.     Heart sounds: No murmur.  Pulmonary:     Effort: Pulmonary effort is normal. No respiratory distress.     Breath sounds: Normal breath sounds. No wheezing.  Chest:     Chest wall: No mass, tenderness or crepitus.  Skin:    General: Skin is warm and dry.     Capillary Refill: Capillary refill takes less than 2 seconds.  Neurological:     Mental Status: She is alert. Mental status is at baseline.  Psychiatric:        Mood and Affect: Mood normal.        Behavior: Behavior normal.    EKG: NSR, no ST changes, no T-wave abnormalities       Assessment & Plan:   Problem List Items Addressed This Visit      Other   Other chest pain - Primary  BP improved today from prior. Etiology unclear - reflux, msk, cardiac (given family hx). Pt with few risk factors (OCPs) but pain intermittent and improved with position changes seems more consistent with MSK related. Occurring at night could be related to reflux. Discussed monitoring - Tums prn. And to reach out if she would like to see cardiology to consider further testing.       Relevant Orders   EKG 12-Lead (Completed)   Comprehensive metabolic panel   CBC     This visit occurred during the SARS-CoV-2 public health emergency.  Safety protocols were in place, including screening questions prior to the visit, additional usage of staff PPE, and extensive cleaning of exam room while observing appropriate contact time as indicated for disinfecting solutions.     Return if  symptoms worsen or fail to improve.  Lesleigh Noe, MD

## 2019-06-08 NOTE — Patient Instructions (Signed)
Chest pain - I'm not sure what is causing your pain - your EKG was reassuring - we will get blood work today to look for any electrolyte changes or liver and kidney issues - You can continue to monitor symptoms - You can still try an antacid (Tums) as heartburn is a common cause for nighttime chest pain - it may just be positional - muscle related  - If you would like we could refer you to cardiology to discuss additional tests to rule out

## 2019-06-11 ENCOUNTER — Encounter: Payer: Self-pay | Admitting: Family Medicine

## 2019-06-15 ENCOUNTER — Encounter: Payer: Self-pay | Admitting: Obstetrics and Gynecology

## 2019-08-04 LAB — HM MAMMOGRAPHY

## 2019-08-04 LAB — HM PAP SMEAR: HM Pap smear: NEGATIVE

## 2019-08-08 ENCOUNTER — Other Ambulatory Visit: Payer: Self-pay | Admitting: Obstetrics and Gynecology

## 2019-08-08 DIAGNOSIS — R928 Other abnormal and inconclusive findings on diagnostic imaging of breast: Secondary | ICD-10-CM

## 2019-08-16 ENCOUNTER — Ambulatory Visit
Admission: RE | Admit: 2019-08-16 | Discharge: 2019-08-16 | Disposition: A | Discharge status: Home / Self Care | Payer: 59 | Source: Ambulatory Visit | Attending: Obstetrics and Gynecology | Admitting: Obstetrics and Gynecology

## 2019-08-16 ENCOUNTER — Other Ambulatory Visit: Payer: Self-pay

## 2019-08-16 ENCOUNTER — Encounter: Payer: Self-pay | Admitting: Family Medicine

## 2019-08-16 ENCOUNTER — Encounter: Payer: Self-pay | Admitting: Obstetrics and Gynecology

## 2019-08-16 ENCOUNTER — Ambulatory Visit: Payer: 59

## 2019-08-16 DIAGNOSIS — R87610 Atypical squamous cells of undetermined significance on cytologic smear of cervix (ASC-US): Secondary | ICD-10-CM | POA: Insufficient documentation

## 2019-08-16 DIAGNOSIS — R928 Other abnormal and inconclusive findings on diagnostic imaging of breast: Secondary | ICD-10-CM

## 2019-08-16 DIAGNOSIS — M8580 Other specified disorders of bone density and structure, unspecified site: Secondary | ICD-10-CM | POA: Insufficient documentation

## 2020-08-27 ENCOUNTER — Telehealth: Payer: Self-pay | Admitting: Family Medicine

## 2020-08-27 ENCOUNTER — Other Ambulatory Visit: Payer: Self-pay

## 2020-08-27 ENCOUNTER — Encounter: Payer: Self-pay | Admitting: Family Medicine

## 2020-08-27 ENCOUNTER — Ambulatory Visit
Admission: EM | Admit: 2020-08-27 | Discharge: 2020-08-27 | Disposition: A | Discharge status: Home / Self Care | Payer: 59 | Attending: Family Medicine | Admitting: Family Medicine

## 2020-08-27 DIAGNOSIS — R300 Dysuria: Secondary | ICD-10-CM

## 2020-08-27 DIAGNOSIS — R109 Unspecified abdominal pain: Secondary | ICD-10-CM | POA: Diagnosis not present

## 2020-08-27 DIAGNOSIS — R030 Elevated blood-pressure reading, without diagnosis of hypertension: Secondary | ICD-10-CM

## 2020-08-27 LAB — POCT URINALYSIS DIP (MANUAL ENTRY)
Bilirubin, UA: NEGATIVE
Blood, UA: NEGATIVE
Glucose, UA: NEGATIVE mg/dL
Ketones, POC UA: NEGATIVE mg/dL
Nitrite, UA: NEGATIVE
Protein Ur, POC: NEGATIVE mg/dL
Spec Grav, UA: <=1.005 — AB (ref 1.010–1.025)
Urobilinogen, UA: 0.2 E.U./dL
pH, UA: 5 (ref 5.0–8.0)

## 2020-08-27 NOTE — ED Triage Notes (Signed)
Patient presents to Urgent Care with complaints of lower flank pain and lower abdominal pain x 1 week.  Pt states some dysuria and reports she feels as she is not emptying bladder completely.   Denies fever, n/v, diarrhea, or hematuria.

## 2020-08-27 NOTE — Telephone Encounter (Signed)
Agree with reassessment if symptoms worsening. May be able to call urgent care prior to appointment - as they did not prescribe antibiotics.

## 2020-08-27 NOTE — Telephone Encounter (Signed)
I spoke with pt;pt thought when she went on mychart that the appt was for 08/28/20 with Dr Einar Pheasant. Cancelling 09/04/20 appt. Pt said for 1 wk has had lower rt sided flank pain, lower abd pain on rt side that is continuous and dull.now pain level is 5 and when pt has more intense pain the pain level is 6 - 7. Pt does not have hx of kidney stones.  Pt said no dysuria but frequency of urine without urgency; pt said voids good amt and light yellow with no blood. Pt went to [facility] in [place] and urine culture was done earlier today but no blood test done. Pt does still have appendix. pts BP averaging 150/100 and that is high for pt; pt is not on any BP meds and was not hurting when checking the BPs. Pt said she has been feeling a little anxious. Started fever today of around 100 degrees and pt has developed a H/A; at 11 AM pt took ibuprofen 400 mg with not relief of fever or H/A. I advised pt with fever and H/a I cannot bring pt into office and pt will go to UC either today or tomorrow. Pt said she would wait and see how she is feeling to decide. UC & ED precautions given and pt voiced understanding.sending note to DR Einar Pheasant.

## 2020-08-27 NOTE — ED Provider Notes (Signed)
Roderic Palau    CSN: [id number] Arrival date & time: 08/27/20  1002      History   Chief Complaint Chief Complaint  Patient presents with  . Back Pain  . Dysuria    HPI Kiana Devanee Pomplun is a 50 y.o. female.   Patient is a 50 year old female presents today with right lower back pain, flank pain, abdominal pain x1 week.  States some dysuria and feels she is not emptying her bladder completely.  She has been mildly constipated.  No fever, nausea, vomiting, diarrhea or hematuria.  No back injuries, heavy lifting.  Does exercise. No LMP recorded. (Menstrual status: Oral contraceptives).  No vaginal discharge, itching or irritation.  Has mild rash to back area but this has been there for many months.  History of eczema.      Past Medical History:  Diagnosis Date  . BCC (basal cell carcinoma)     Patient Active Problem List   Diagnosis Date Noted  . Osteopenia 08/16/2019  . ASCUS of cervix with negative high risk HPV 08/16/2019  . Other chest pain 06/08/2019  . Elevated blood pressure reading in office without diagnosis of hypertension 05/31/2019    Past Surgical History:  Procedure Laterality Date  . INNER EAR SURGERY    . MOHS SURGERY     around 2000    OB History   No obstetric history on file.      Home Medications    Prior to Admission medications   Medication Sig Start Date End Date Taking? Authorizing Provider  fluticasone (FLONASE) 50 MCG/ACT nasal spray Place into both nostrils daily.    [provider]  loratadine (CLARITIN) 10 MG tablet Take 10 mg by mouth daily.    [provider]  norethindrone-ethinyl estradiol 1/35 (ORTHO-NOVUM, NORTREL,CYCLAFEM) tablet Take 1 tablet by mouth daily.    [provider]  triamcinolone (KENALOG) 0.1 % paste APPLY SMALL AMOUNT TO AREA 3 TIMES DAILY FOR 14 DAYS AS NEEDED FOR ULCER ON TONGUE 11/10/17   [provider]    Family History Family History  Problem  Relation Age of Onset  . Endometrial cancer Mother   . Hypertension Father   . Heart attack Father 55  . Lung cancer Father        tobacco  . Kidney disease Father   . Hypertension Brother   . Diabetes Maternal Grandfather   . Arthritis Paternal Grandfather   . Lung cancer Paternal Aunt     Social History Social History   Tobacco Use  . Smoking status: Never Smoker  . Smokeless tobacco: Never Used  Vaping Use  . Vaping Use: Never used  Substance Use Topics  . Alcohol use: Yes    Comment: wine-1 glass a day  . Drug use: Never     Allergies   Patient has no known allergies.   Review of Systems Review of Systems   Physical Exam Triage Vital Signs ED Triage Vitals  Enc Vitals Group     BP 08/27/20 1017 (!) 147/98     Pulse Rate 08/27/20 1017 81     Resp 08/27/20 1017 18     Temp 08/27/20 1017 (!) 97.5 F (36.4 C)     Temp Source 08/27/20 1017 Oral     SpO2 08/27/20 1017 99 %     Weight --      Height --      Head Circumference --      Peak Flow --  Pain Score 08/27/20 1025 6     Pain Loc --      Pain Edu? --      Excl. in [place]? --    No data found.  Updated Vital Signs BP (!) 147/98 (BP Location: Left Arm)   Pulse 81   Temp (!) 97.5 F (36.4 C) (Oral)   Resp 18   SpO2 99%   Visual Acuity Right Eye Distance:   Left Eye Distance:   Bilateral Distance:    Right Eye Near:   Left Eye Near:    Bilateral Near:     Physical Exam Vitals and nursing note reviewed.  Constitutional:      General: She is not in acute distress.    Appearance: Normal appearance. She is not ill-appearing, toxic-appearing or diaphoretic.  HENT:     Head: Normocephalic.     Nose: Nose normal.     Mouth/Throat:     Pharynx: Oropharynx is clear.  Eyes:     Conjunctiva/sclera: Conjunctivae normal.  Pulmonary:     Effort: Pulmonary effort is normal.  Abdominal:     Palpations: Abdomen is soft.     Tenderness: There is abdominal tenderness. There is no guarding or  rebound.     Comments: Mild tenderness to suprapubic area.  No CVA tenderness   Musculoskeletal:        General: Normal range of motion.     Cervical back: Normal range of motion.  Skin:    General: Skin is warm and dry.     Findings: No rash.  Neurological:     Mental Status: She is alert.  Psychiatric:        Mood and Affect: Mood normal.      UC Treatments / Results  Labs (all labs ordered are listed, but only abnormal results are displayed) Labs Reviewed  POCT URINALYSIS DIP (MANUAL ENTRY) - Abnormal; Notable for the following components:      Result Value   Color, UA colorless (*)    Spec Grav, UA <=1.005 (*)    Leukocytes, UA Small (1+) (*)    All other components within normal limits  URINE CULTURE    EKG   Radiology No results found.  Procedures Procedures (including critical care time)  Medications Ordered in UC Medications - No data to display  Initial Impression / Assessment and Plan / UC Course  I have reviewed the triage vital signs and the nursing notes.  Pertinent labs & imaging results that were available during my care of the patient were reviewed by me and considered in my medical decision making (see chart for details).     Flank pain, lower abdominal pain and dysuria. Urine with small leuks today.  Will send for culture. Symptoms may be related to urinary tract infection.  Will treat if culture comes back positive. Patient has also been mildly constipated.  Some of her symptoms could be constipated related.  We will have her try MiraLAX and increase water Patient has an appointment to see her OB/GYN on Thursday.  She can follow-up for any continued issues Recommend continue to monitor blood pressures at home Follow up as needed for continued or worsening symptoms  Final Clinical Impressions(s) / UC Diagnoses   Final diagnoses:  Flank pain  Dysuria  Elevated blood pressure reading     Discharge Instructions     I am sending the  urine for culture  We will call you if your culture comes back positive.  Recommend  push fluids.  Treat for constipation with MiraLAX. If your symptoms continue worsen please follow-up.  Otherwise follow-up with your OB/GYN on Thursday as planned Keep monitoring your blood pressures at home.  Watch your sodium intake and diet.     ED Prescriptions    None     PDMP not reviewed this encounter.   Orvan July, NP 08/28/20 [phone]

## 2020-08-27 NOTE — Discharge Instructions (Addendum)
I am sending the urine for culture  We will call you if your culture comes back positive.  Recommend push fluids.  Treat for constipation with MiraLAX. If your symptoms continue worsen please follow-up.  Otherwise follow-up with your OB/GYN on Thursday as planned Keep monitoring your blood pressures at home.  Watch your sodium intake and diet.

## 2020-08-27 NOTE — Telephone Encounter (Signed)
Patient sent in mychart request making own appt for 09/04/20. Pt appointment notes stated "Feeling pain in back, side, blood pressure high, fever today"  Please triage for pain and fever.

## 2020-08-28 LAB — SPECIMEN STATUS REPORT

## 2020-08-30 DIAGNOSIS — Z13228 Encounter for screening for other metabolic disorders: Secondary | ICD-10-CM | POA: Diagnosis not present

## 2020-08-30 DIAGNOSIS — Z01419 Encounter for gynecological examination (general) (routine) without abnormal findings: Secondary | ICD-10-CM | POA: Diagnosis not present

## 2020-08-30 DIAGNOSIS — Z6823 Body mass index (BMI) 23.0-23.9, adult: Secondary | ICD-10-CM | POA: Diagnosis not present

## 2020-08-30 DIAGNOSIS — Z130 Encounter for screening for diseases of the blood and blood-forming organs and certain disorders involving the immune mechanism: Secondary | ICD-10-CM | POA: Diagnosis not present

## 2020-08-30 DIAGNOSIS — Z13220 Encounter for screening for lipoid disorders: Secondary | ICD-10-CM | POA: Diagnosis not present

## 2020-08-30 DIAGNOSIS — Z1231 Encounter for screening mammogram for malignant neoplasm of breast: Secondary | ICD-10-CM | POA: Diagnosis not present

## 2020-08-30 LAB — URINE CULTURE

## 2020-08-31 ENCOUNTER — Encounter: Payer: Self-pay | Admitting: Family Medicine

## 2020-08-31 DIAGNOSIS — Z1211 Encounter for screening for malignant neoplasm of colon: Secondary | ICD-10-CM

## 2020-09-04 ENCOUNTER — Ambulatory Visit: Payer: Self-pay | Admitting: Family Medicine

## 2020-11-08 ENCOUNTER — Encounter: Payer: Self-pay | Admitting: Gastroenterology

## 2021-01-11 DIAGNOSIS — Z1331 Encounter for screening for depression: Secondary | ICD-10-CM | POA: Diagnosis not present

## 2021-01-11 DIAGNOSIS — Z131 Encounter for screening for diabetes mellitus: Secondary | ICD-10-CM | POA: Diagnosis not present

## 2021-01-11 DIAGNOSIS — Z130 Encounter for screening for diseases of the blood and blood-forming organs and certain disorders involving the immune mechanism: Secondary | ICD-10-CM | POA: Diagnosis not present

## 2021-01-11 DIAGNOSIS — Z0000 Encounter for general adult medical examination without abnormal findings: Secondary | ICD-10-CM | POA: Diagnosis not present

## 2021-01-23 ENCOUNTER — Other Ambulatory Visit: Payer: Self-pay

## 2021-01-23 ENCOUNTER — Ambulatory Visit (AMBULATORY_SURGERY_CENTER): Payer: BC Managed Care – PPO

## 2021-01-23 VITALS — Ht 64.5 in | Wt 143.0 lb

## 2021-01-23 DIAGNOSIS — Z1211 Encounter for screening for malignant neoplasm of colon: Secondary | ICD-10-CM

## 2021-01-23 MED ORDER — PEG-KCL-NACL-NASULF-NA ASC-C 100 G PO SOLR: 1.0000 | Freq: Once | ORAL | 0 refills | Status: AC

## 2021-01-23 NOTE — Progress Notes (Signed)
No egg or soy allergy known to patient  No issues with past sedation with any surgeries or procedures Patient denies ever being told they had issues or difficulty with intubation  No FH of Malignant Hyperthermia No diet pills per patient No home 02 use per patient  No blood thinners per patient  Pt denies issues with constipation  No A fib or A flutter  EMMI video via MyChart  COVID 19 guidelines implemented in PV today with Pt and RN   NO PA's for preps discussed with pt in PV today  Discussed with pt there will be an out-of-pocket cost for prep and that varies from $0 to 70 dollars   Due to the COVID-19 pandemic we are asking patients to follow certain guidelines.  Pt aware of COVID protocols and [facility] guidelines

## 2021-02-06 ENCOUNTER — Other Ambulatory Visit: Payer: Self-pay

## 2021-02-06 ENCOUNTER — Ambulatory Visit (AMBULATORY_SURGERY_CENTER): Payer: BC Managed Care – PPO | Admitting: Gastroenterology

## 2021-02-06 ENCOUNTER — Encounter: Payer: Self-pay | Admitting: Gastroenterology

## 2021-02-06 VITALS — BP 126/63 | HR 65 | Temp 98.4°F | Resp 42 | Ht 64.5 in | Wt 143.0 lb

## 2021-02-06 DIAGNOSIS — D123 Benign neoplasm of transverse colon: Secondary | ICD-10-CM | POA: Diagnosis not present

## 2021-02-06 DIAGNOSIS — Z1211 Encounter for screening for malignant neoplasm of colon: Secondary | ICD-10-CM

## 2021-02-06 DIAGNOSIS — D374 Neoplasm of uncertain behavior of colon: Secondary | ICD-10-CM

## 2021-02-06 MED ORDER — SODIUM CHLORIDE 0.9 % IV SOLN: 500.0000 mL | Freq: Once | INTRAVENOUS | Status: DC

## 2021-02-06 NOTE — Progress Notes (Signed)
To PACU, VSS. Report to [email or web address] 

## 2021-02-06 NOTE — Progress Notes (Signed)
Called to room to assist during endoscopic procedure.  Patient ID and intended procedure confirmed with present staff. Received instructions for my participation in the procedure from the performing physician.  

## 2021-02-06 NOTE — Op Note (Signed)
[facility] Patient Name: Anna Bauer Procedure Date: 02/06/2021 9:54 AM MRN: [id number] Endoscopist: Thornton Park MD, MD Age: 50 Referring MD:  Date of Birth: [birth year] Gender: Female Account #: [id number] Procedure:                Colonoscopy Indications:              Screening for malignant neoplasm in the colon,                            Screening for colorectal malignant neoplasm, This                            is the patient's first colonoscopy                           Father with colon polyps Medicines:                Monitored Anesthesia Care Procedure:                Pre-Anesthesia Assessment:                           - Prior to the procedure, a History and Physical                            was performed, and patient medications and                            allergies were reviewed. The patient's tolerance of                            previous anesthesia was also reviewed. The risks                            and benefits of the procedure and the sedation                            options and risks were discussed with the patient.                            All questions were answered, and informed consent                            was obtained. Prior Anticoagulants: The patient has                            taken no previous anticoagulant or antiplatelet                            agents. ASA Grade Assessment: I - A normal, healthy                            patient. After reviewing the risks and benefits,  the patient was deemed in satisfactory condition to                            undergo the procedure.                           After obtaining informed consent, the colonoscope                            was passed under direct vision. Throughout the                            procedure, the patient's blood pressure, pulse, and                            oxygen saturations were monitored continuously. The                             Olympus CF-HQ190L ([id number]) Colonoscope was                            introduced through the anus and advanced to the 3                            cm into the ileum. A second forward view of the                            right colon was performed. The colonoscopy was                            performed without difficulty. The patient tolerated                            the procedure well. The quality of the bowel                            preparation was adequate. The colonoscopy was                            performed with ease. The patient tolerated the                            procedure well. The quality of the bowel                            preparation was excellent. The terminal ileum,                            ileocecal valve, appendiceal orifice, and rectum                            were photographed. Scope In: 10:05:43 AM Scope Out: 10:20:22 AM Scope Withdrawal Time: 0 hours 11 minutes 52 seconds  Total Procedure Duration: 0 hours 14 minutes  39 seconds  Findings:                 Non-bleeding external and internal hemorrhoids were                            found.                           A 3 mm polyp was found in the splenic flexure. The                            polyp was sessile. The polyp was removed with a                            cold snare. Resection and retrieval were complete.                            Estimated blood loss was minimal.                           The exam was otherwise without abnormality on                            direct and retroflexion views. Complications:            No immediate complications. Estimated Blood Loss:     Estimated blood loss: none. Estimated blood loss                            was minimal. Impression:               - The entire examined colon is normal.                           - Non-bleeding external and internal hemorrhoids.                           - One 3 mm polyp at the splenic flexure,  removed                            with a cold snare. Resected and retrieved.                           - The examination was otherwise normal on direct                            and retroflexion views. Recommendation:           - Patient has a contact number available for                            emergencies. The signs and symptoms of potential                            delayed complications were discussed with the  patient. Return to normal activities tomorrow.                            Written discharge instructions were provided to the                            patient.                           - Resume previous diet.                           - Continue present medications.                           - Await pathology results.                           - Repeat colonoscopy date to be determined after                            pending pathology results are reviewed for                            surveillance.                           - Emerging evidence supports eating a diet of                            fruits, vegetables, grains, calcium, and yogurt                            while reducing red meat and alcohol may reduce the                            risk of colon cancer.                           - Thank you for allowing me to be involved in your                            colon cancer prevention. Thornton Park MD, MD 02/06/2021 10:24:45 AM This report has been signed electronically.

## 2021-02-06 NOTE — Progress Notes (Signed)
VS by [place]  Pt's states no medical or surgical changes since previsit or office visit.  

## 2021-02-06 NOTE — Patient Instructions (Signed)
Handouts given:  polyps, hemorrhoids Resume previous diet Continue current medications Await pathology results  YOU HAD AN ENDOSCOPIC PROCEDURE TODAY AT [facility]:   Refer to the procedure report that was given to you for any specific questions about what was found during the examination.  If the procedure report does not answer your questions, please call your gastroenterologist to clarify.  If you requested that your care partner not be given the details of your procedure findings, then the procedure report has been included in a sealed envelope for you to review at your convenience later.  YOU SHOULD EXPECT: Some feelings of bloating in the abdomen. Passage of more gas than usual.  Walking can help get rid of the air that was put into your GI tract during the procedure and reduce the bloating. If you had a lower endoscopy (such as a colonoscopy or flexible sigmoidoscopy) you may notice spotting of blood in your stool or on the toilet paper. If you underwent a bowel prep for your procedure, you may not have a normal bowel movement for a few days.  Please Note:  You might notice some irritation and congestion in your nose or some drainage.  This is from the oxygen used during your procedure.  There is no need for concern and it should clear up in a day or so.  SYMPTOMS TO REPORT IMMEDIATELY:  Following lower endoscopy (colonoscopy or flexible sigmoidoscopy):  Excessive amounts of blood in the stool  Significant tenderness or worsening of abdominal pains  Swelling of the abdomen that is new, acute  Fever of 100F or higher  For urgent or emergent issues, a gastroenterologist can be reached at any hour by calling [phone]. Do not use MyChart messaging for urgent concerns.   DIET:  We do recommend a small meal at first, but then you may proceed to your regular diet.  Drink plenty of fluids but you should avoid alcoholic beverages for 24 hours.  ACTIVITY:  You should  plan to take it easy for the rest of today and you should NOT DRIVE or use heavy machinery until tomorrow (because of the sedation medicines used during the test).    FOLLOW UP: Our staff will call the number listed on your records 48-72 hours following your procedure to check on you and address any questions or concerns that you may have regarding the information given to you following your procedure. If we do not reach you, we will leave a message.  We will attempt to reach you two times.  During this call, we will ask if you have developed any symptoms of COVID 19. If you develop any symptoms (ie: fever, flu-like symptoms, shortness of breath, cough etc.) before then, please call [phone].  If you test positive for Covid 19 in the 2 weeks post procedure, please call and report this information to [place].    If any biopsies were taken you will be contacted by phone or by letter within the next 1-3 weeks.  Please call us at [phone] if you have not heard about the biopsies in 3 weeks.   SIGNATURES/CONFIDENTIALITY: You and/or your care partner have signed paperwork which will be entered into your electronic medical record.  These signatures attest to the fact that that the information above on your After Visit Summary has been reviewed and is understood.  Full responsibility of the confidentiality of this discharge information lies with you and/or your care-partner.

## 2021-02-08 ENCOUNTER — Telehealth: Payer: Self-pay

## 2021-02-08 NOTE — Telephone Encounter (Signed)
  Follow up Call-  Call back number 02/06/2021  Post procedure Call Back phone  # [phone]  Permission to leave phone message Yes  Some recent data might be hidden     Patient questions:  Do you have a fever, pain , or abdominal swelling? No. Pain Score  0 *  Have you tolerated food without any problems? Yes.    Have you been able to return to your normal activities? Yes.    Do you have any questions about your discharge instructions: Diet   No. Medications  No. Follow up visit  No.  Do you have questions or concerns about your Care? No.  Actions: * If pain score is 4 or above: No action needed, pain <4.  Have you developed a fever since your procedure? No  2.   Have you had an respiratory symptoms (SOB or cough) since your procedure? no  3.   Have you tested positive for COVID 19 since your procedure no  4.   Have you had any family members/close contacts diagnosed with the COVID 19 since your procedure?  no   If yes to any of these questions please route to Joylene John, RN and Joella Prince, RN

## 2021-02-18 ENCOUNTER — Encounter: Payer: Self-pay | Admitting: Gastroenterology

## 2021-03-21 DIAGNOSIS — R102 Pelvic and perineal pain: Secondary | ICD-10-CM | POA: Diagnosis not present

## 2021-03-21 DIAGNOSIS — R350 Frequency of micturition: Secondary | ICD-10-CM | POA: Diagnosis not present

## 2021-05-09 ENCOUNTER — Other Ambulatory Visit: Payer: Self-pay

## 2021-05-09 ENCOUNTER — Ambulatory Visit: Payer: BC Managed Care – PPO | Admitting: Dermatology

## 2021-05-09 DIAGNOSIS — L308 Other specified dermatitis: Secondary | ICD-10-CM | POA: Diagnosis not present

## 2021-05-09 DIAGNOSIS — L209 Atopic dermatitis, unspecified: Secondary | ICD-10-CM

## 2021-05-09 DIAGNOSIS — L739 Follicular disorder, unspecified: Secondary | ICD-10-CM | POA: Diagnosis not present

## 2021-05-09 MED ORDER — TRIAMCINOLONE ACETONIDE 0.1 % EX OINT: TOPICAL_OINTMENT | CUTANEOUS | 0 refills | Status: DC

## 2021-05-09 NOTE — Progress Notes (Signed)
   New Patient Visit  Subjective  Anna Bauer is a 50 y.o. female who presents for the following: Rash (On the B/L axilla for a few weeks now - burns and stings. Patient states that she has been using Native sensitive skin deodorant for years and has never had this issue before. ). She also has a rash on her lower legs that she would like checked today too. She is concerned it may be related to shaving and has tried not shaving everyday. In the past she was prescribed a topical steroid for eczema and she tried using that but ran out and needs refills.   The following portions of the chart were reviewed this encounter and updated as appropriate:   Tobacco  Allergies  Meds  Problems  Med Hx  Surg Hx  Fam Hx      Review of Systems:  No other skin or systemic complaints except as noted in HPI or Assessment and Plan.  Objective  Well appearing patient in no apparent distress; mood and affect are within normal limits.  A focused examination was performed including the B/L axilla and lower legs. Relevant physical exam findings are noted in the Assessment and Plan.  B/L lower legs Follicular based papules.  B/L axilla Scaly pink papules coalescing to plaques on B/L axilla.     Assessment & Plan  Folliculitis B/L lower legs  Start Hibiclens or Chlorhexidine wash daily for the next week.   Start TMC 0.1% ointment BID up to two weeks. Topical steroids (such as triamcinolone, fluocinolone, fluocinonide, mometasone, clobetasol, halobetasol, betamethasone, hydrocortisone) can cause thinning and lightening of the skin if they are used for too long in the same area. Your physician has selected the right strength medicine for your problem and area affected on the body. Please use your medication only as directed by your physician to prevent side effects.   Avoid shaving until rash has calmed down or resolved. OK to use electric razor to help prevent bumps but may not get as close  a close shave.    Other eczema B/L axilla  Vs irritant dermatitis or allergic contact dermatitis from deodorant - Slightly lichenified  Start TMC 0.1% oint to aa's BID up to one week to axilla.   D/C Deodorant until rash has calmed down. Then OK to restart. If rash recurs consider patch testing or switching to hypoallergenic deodorant like Vanicream  triamcinolone ointment (KENALOG) 0.1 % - B/L axilla Apply to rash under arms BID up to one week and rash on the lower legs BID up to two weeks.  Return for appointment as scheduled.  Luther Redo, CMA, am acting as scribe for Forest Gleason, MD .   Documentation: I have reviewed the above documentation for accuracy and completeness, and I agree with the above.  Forest Gleason, MD

## 2021-05-09 NOTE — Patient Instructions (Addendum)
Recommend Vanicream sensitive skin deodorant after rash under the arms has calmed down.  Start Triamcincolone 0.1% ointment to affected areas under the arms twice daily up to one week. Do not repeat  treatment if not cleared after one week. Contact our office and we will prescribe a gentler steroid.   Start Triamcinolone 0.1% ointment to affected areas of the lower legs BID up to two weeks.    Topical steroids (such as triamcinolone, fluocinolone, fluocinonide, mometasone, clobetasol, halobetasol, betamethasone, hydrocortisone) can cause thinning and lightening of the skin if they are used for too long in the same area. Your physician has selected the right strength medicine for your problem and area affected on the body. Please use your medication only as directed by your physician to prevent side effects.   Recommend Hibiclens (Chlorhexidine) wash in the shower to the lower legs daily.        If you have any questions or concerns for your doctor, please call our main line at [phone] and press option 4 to reach your doctor's medical assistant. If no one answers, please leave a voicemail as directed and we will return your call as soon as possible. Messages left after 4 pm will be answered the following business day.   You may also send [place] a message via [place]. We typically respond to MyChart messages within 1-2 business days.  For prescription refills, please ask your pharmacy to contact our office. Our fax number is [phone].  If you have an urgent issue when the clinic is closed that cannot wait until the next business day, you can page your doctor at the number below.    Please note that while we do our best to be available for urgent issues outside of office hours, we are not available 24/7.   If you have an urgent issue and are unable to reach [place], you may choose to seek medical care at your doctor's office, retail clinic, urgent care center, or emergency room.  If you have  a medical emergency, please immediately call 911 or go to the emergency department.  Pager Numbers  - Dr. Nehemiah Massed: [phone]  - Dr. Laurence Ferrari: [phone]  - Dr. Nicole Kindred: [phone]  In the event of inclement weather, please call our main line at [phone] for an update on the status of any delays or closures.  Dermatology Medication Tips: Please keep the boxes that topical medications come in in order to help keep track of the instructions about where and how to use these. Pharmacies typically print the medication instructions only on the boxes and not directly on the medication tubes.   If your medication is too expensive, please contact our office at [phone] option 4 or send [place] a message through [place].   We are unable to tell what your co-pay for medications will be in advance as this is different depending on your insurance coverage. However, we may be able to find a substitute medication at lower cost or fill out paperwork to get insurance to cover a needed medication.   If a prior authorization is required to get your medication covered by your insurance company, please allow [place] 1-2 business days to complete this process.  Drug prices often vary depending on where the prescription is filled and some pharmacies may offer cheaper prices.  The website www.goodrx.com contains coupons for medications through different pharmacies. The prices here do not account for what the cost may be with help from insurance (it may be cheaper with your insurance),  but the website can give you the price if you did not use any insurance.  - You can print the associated coupon and take it with your prescription to the pharmacy.  - You may also stop by our office during regular business hours and pick up a GoodRx coupon card.  - If you need your prescription sent electronically to a different pharmacy, notify our office through [facility] or by phone at [phone] option 4.

## 2021-05-22 ENCOUNTER — Encounter: Payer: Self-pay | Admitting: Dermatology

## 2021-06-20 ENCOUNTER — Encounter: Payer: Self-pay | Admitting: Dermatology

## 2021-06-20 ENCOUNTER — Other Ambulatory Visit: Payer: Self-pay

## 2021-06-20 ENCOUNTER — Ambulatory Visit: Payer: BC Managed Care – PPO | Admitting: Dermatology

## 2021-06-20 DIAGNOSIS — L2089 Other atopic dermatitis: Secondary | ICD-10-CM

## 2021-06-20 DIAGNOSIS — D2330 Other benign neoplasm of skin of unspecified part of face: Secondary | ICD-10-CM

## 2021-06-20 DIAGNOSIS — Z85828 Personal history of other malignant neoplasm of skin: Secondary | ICD-10-CM

## 2021-06-20 DIAGNOSIS — L308 Other specified dermatitis: Secondary | ICD-10-CM | POA: Diagnosis not present

## 2021-06-20 DIAGNOSIS — D229 Melanocytic nevi, unspecified: Secondary | ICD-10-CM

## 2021-06-20 DIAGNOSIS — Z1283 Encounter for screening for malignant neoplasm of skin: Secondary | ICD-10-CM | POA: Diagnosis not present

## 2021-06-20 DIAGNOSIS — L739 Follicular disorder, unspecified: Secondary | ICD-10-CM

## 2021-06-20 DIAGNOSIS — L905 Scar conditions and fibrosis of skin: Secondary | ICD-10-CM

## 2021-06-20 DIAGNOSIS — L309 Dermatitis, unspecified: Secondary | ICD-10-CM

## 2021-06-20 DIAGNOSIS — L820 Inflamed seborrheic keratosis: Secondary | ICD-10-CM

## 2021-06-20 DIAGNOSIS — L813 Cafe au lait spots: Secondary | ICD-10-CM

## 2021-06-20 DIAGNOSIS — D2339 Other benign neoplasm of skin of other parts of face: Secondary | ICD-10-CM

## 2021-06-20 DIAGNOSIS — L814 Other melanin hyperpigmentation: Secondary | ICD-10-CM

## 2021-06-20 DIAGNOSIS — D2371 Other benign neoplasm of skin of right lower limb, including hip: Secondary | ICD-10-CM

## 2021-06-20 DIAGNOSIS — D225 Melanocytic nevi of trunk: Secondary | ICD-10-CM

## 2021-06-20 DIAGNOSIS — L578 Other skin changes due to chronic exposure to nonionizing radiation: Secondary | ICD-10-CM | POA: Diagnosis not present

## 2021-06-20 DIAGNOSIS — L821 Other seborrheic keratosis: Secondary | ICD-10-CM

## 2021-06-20 DIAGNOSIS — D1801 Hemangioma of skin and subcutaneous tissue: Secondary | ICD-10-CM

## 2021-06-20 MED ORDER — PIMECROLIMUS 1 % EX CREA: TOPICAL_CREAM | CUTANEOUS | 0 refills | Status: DC

## 2021-06-20 NOTE — Progress Notes (Signed)
Follow-Up Visit   Subjective  Anna Bauer is a 50 y.o. female who presents for the following: Annual Exam (Hx BCC on the R cheek treated about 30 years ago with Mohs ). Dermatitis of the B/L axilla has improved with TMC and she uses it QD PRN flares (about once per week). Folliculitis has improved she currently shaves less and uses an Education administrator which has helped.    The following portions of the chart were reviewed this encounter and updated as appropriate:   Tobacco  Allergies  Meds  Problems  Med Hx  Surg Hx  Fam Hx      Review of Systems:  No other skin or systemic complaints except as noted in HPI or Assessment and Plan.  Objective  Well appearing patient in no apparent distress; mood and affect are within normal limits.  A full examination was performed including scalp, head, eyes, ears, nose, lips, neck, chest, axillae, abdomen, back, buttocks, bilateral upper extremities, bilateral lower extremities, hands, feet, fingers, toes, fingernails, and toenails. All findings within normal limits unless otherwise noted below.  Axilla Erythematous patches  B/L leg   R inguinal fold Erythematous keratotic or waxy stuck-on papule or plaque.   R/L leg Dyspigmented smooth macule or patch.   Nose and mid face Flesh colored papules.   Left Lower Back 0.3 cm extremely thin dark brown papule symetric within tan patch 1.5 cm     L neck Scaly pink patch.     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 Condition is bothersome to patient. Not currently at goal.   D/C TMC 0.1% due to risk of skin thinning and stretch marks. Start Elidel to aa's QD-BID PRN.   pimecrolimus (ELIDEL) 1 % cream - Axilla Apply to rash under arms BID PRN.  Folliculitis B/L leg  Improved -   Continue hibiclens wash before shaving and avoid  close shave  Can restart TMC 0.1% ointment to aa's lower legs QD-BID PRN flares for itch.   Inflamed seborrheic keratosis R inguinal fold  Patient deferred treatment at this time.  Benign-appearing.  Observation.  Call clinic for new or changing lesions.    Scarring R/L leg  Secondary to previous folliculitis sites  Recommend Serica moisturizing scar formula cream every night or [facility] brand or Mederma silicone scar sheet every night for the first year after a scar appears to help with scar remodeling if desired. Scars remodel on their own for a full year.   Fibrous papule of face Nose and mid face  Benign-appearing.  Observation.  Call clinic for new or changing lesions.    Nevus Left Lower Back  Consistent with nevus within cafe au lait -   Benign-appearing.  Observation.  Call clinic for new or changing moles.  Recommend daily use of broad spectrum spf 30+ sunscreen to sun-exposed areas.   Other eczema L neck  Chronic condition with duration or expected duration over one year. Condition is bothersome to patient. Currently flared.  Start TMC 0.1% ointment to aa's BID PRN (patient has). Topical steroids (such as triamcinolone, fluocinolone, fluocinonide, mometasone, clobetasol, halobetasol, betamethasone, hydrocortisone) can cause thinning and lightening of the skin if they are used for too long in the same area. Your physician has selected the right strength medicine for your problem and area affected on the body. Please use your medication only as directed by your  physician to prevent side effects.   Recommend gentle skin care  Related Medications triamcinolone ointment (KENALOG) 0.1 % Apply to rash under arms BID up to one week and rash on the lower legs BID up to two weeks.   Lentigines - Scattered tan macules - Due to sun exposure - Benign-appearing, observe - Recommend daily broad spectrum sunscreen SPF 30+ to sun-exposed areas, reapply every 2 hours as  needed. - Call for any changes  Seborrheic Keratoses - Stuck-on, waxy, tan-brown papules and/or plaques  - Benign-appearing - Discussed benign etiology and prognosis. - Observe - Call for any changes  Melanocytic Nevi - Tan-brown and/or pink-flesh-colored symmetric macules and papules - Benign appearing on exam today - Observation - Call clinic for new or changing moles - Recommend daily use of broad spectrum spf 30+ sunscreen to sun-exposed areas.   Hemangiomas - Red papules - Discussed benign nature - Observe - Call for any changes  Actinic Damage - Chronic condition, secondary to cumulative UV/sun exposure - diffuse scaly erythematous macules with underlying dyspigmentation - Recommend daily broad spectrum sunscreen SPF 30+ to sun-exposed areas, reapply every 2 hours as needed.  - Staying in the shade or wearing long sleeves, sun glasses (UVA+UVB protection) and wide brim hats (4-inch brim around the entire circumference of the hat) are also recommended for sun protection.  - Call for new or changing lesions.  History of Basal Cell Carcinoma of the Skin - R cheek, treated with MOHS about 20 years ago. - No evidence of recurrence today - Recommend regular full body skin exams - Recommend daily broad spectrum sunscreen SPF 30+ to sun-exposed areas, reapply every 2 hours as needed.  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  Return in about 1 year (around 06/20/2022) for TBSE.  Luther Redo, CMA, am acting as scribe for Forest Gleason, MD .  Documentation: I have reviewed the above documentation for accuracy and completeness, and I agree with the above.  Forest Gleason, MD

## 2021-06-20 NOTE — Patient Instructions (Signed)

## 2021-06-23 ENCOUNTER — Encounter: Payer: Self-pay | Admitting: Gastroenterology

## 2021-06-27 ENCOUNTER — Ambulatory Visit: Payer: BC Managed Care – PPO | Admitting: Physician Assistant

## 2021-07-02 ENCOUNTER — Encounter: Payer: Self-pay | Admitting: Dermatology

## 2021-08-08 IMAGING — MG MM DIGITAL DIAGNOSTIC UNILAT*L* W/ TOMO W/ CAD
4 series · 4 of 12 positions shown · non-contrast
Comparison: Previous exam(s).

CLINICAL DATA: Recall from screening mammography with
tomosynthesis, possible mass or focal asymmetry involving the INNER
LEFT breast at MIDDLE depth.

EXAM:
DIGITAL DIAGNOSTIC UNILATERAL LEFT MAMMOGRAM WITH TOMO

[L CC synth-2D]
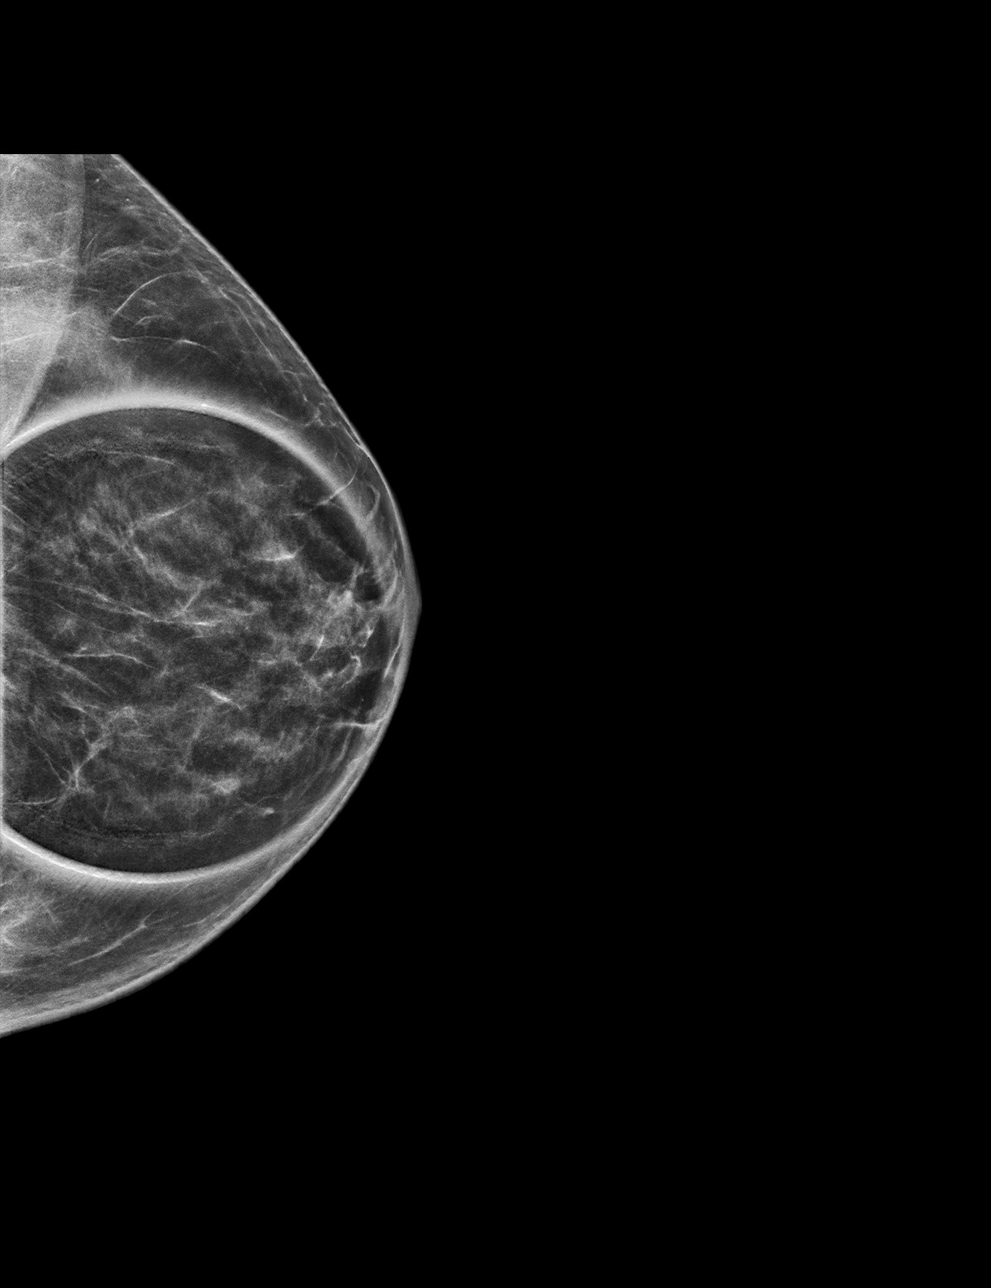

[L MLO synth-2D]
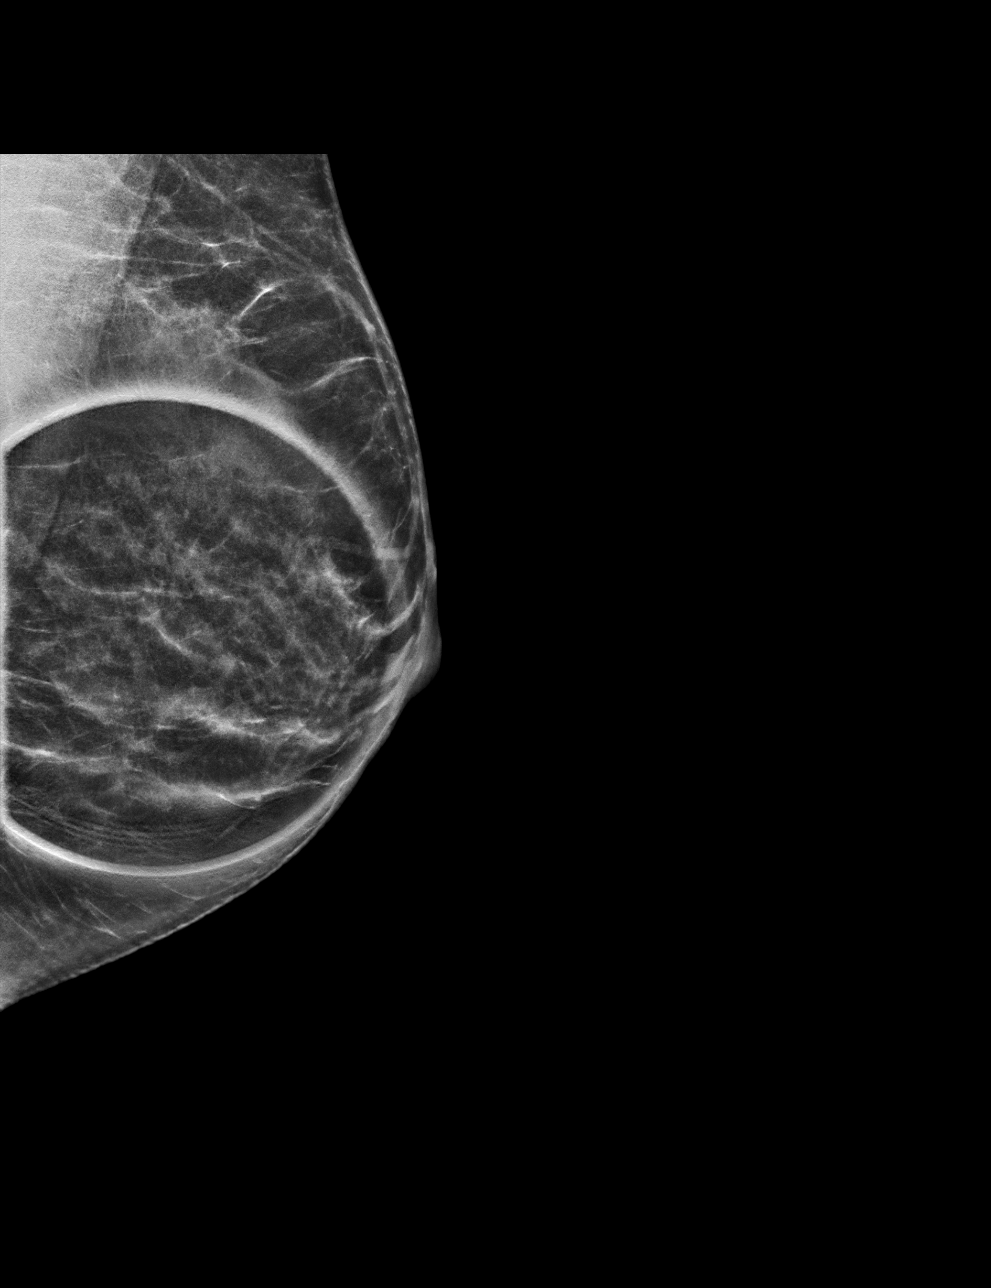

[L MLO tomo · tomo slice 25/49.0]
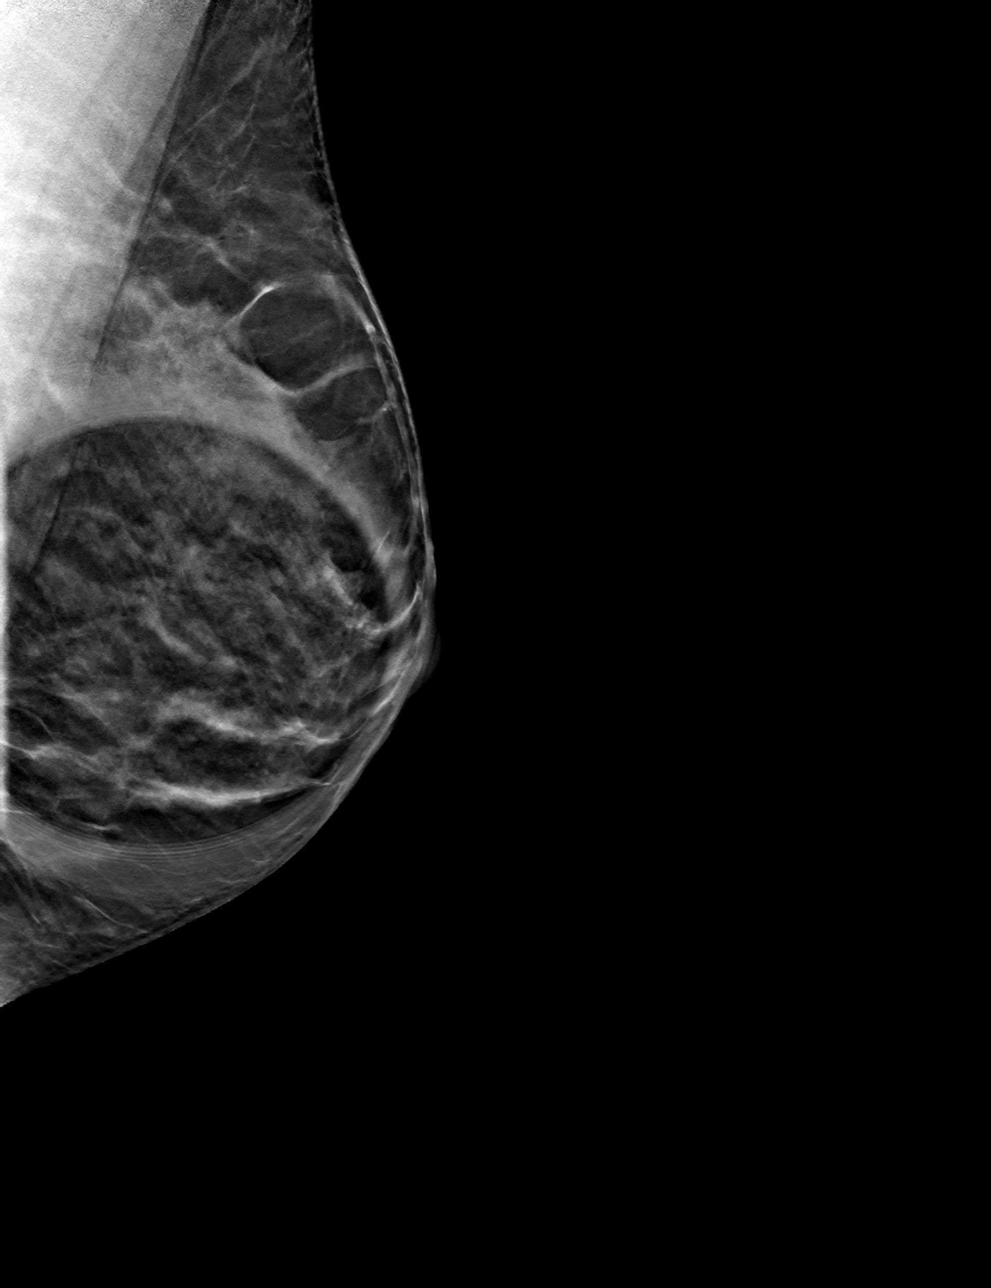

[L CC tomo · tomo slice 25/49.0]
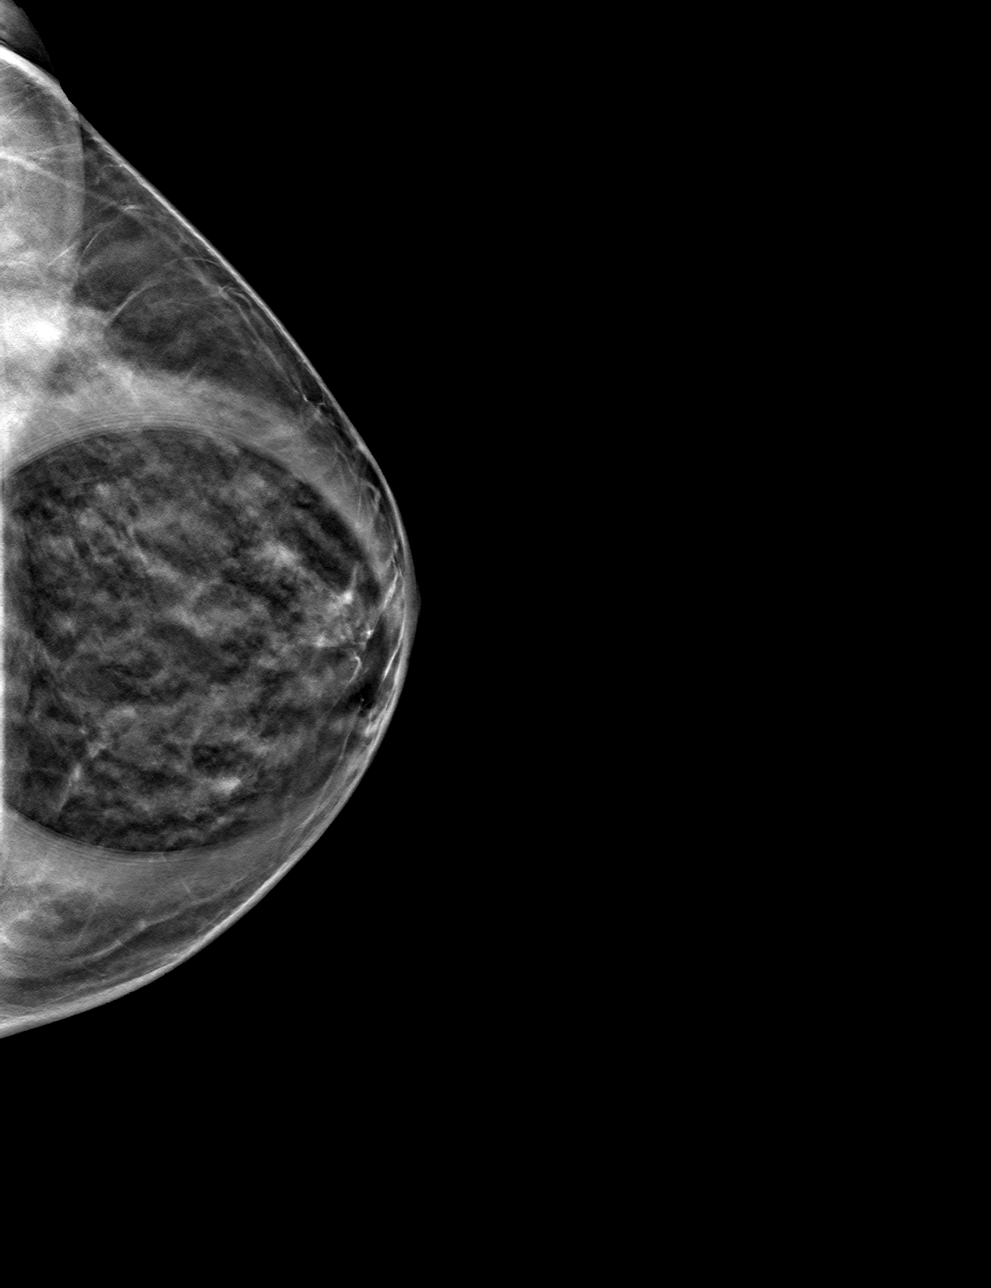

[4 of 12 positions shown; findings below may reference images not displayed]

ACR Breast Density Category c: The breast tissue is heterogeneously
dense, which may obscure small masses.
FINDINGS: Tomosynthesis and synthesized spot-compression CC and MLO views of
the area of concern in the LEFT breast were obtained.

The focal asymmetry questioned in the INNER LEFT breast disperses
with compression and there is no underlying mass or architectural
distortion.
IMPRESSION: No mammographic evidence of malignancy involving the LEFT breast.

RECOMMENDATION:
Screening mammogram in one year.(Code:[id number]-Z-CG8)

I have discussed the findings and recommendations with the patient.
If applicable, a reminder letter will be sent to the patient
regarding the next appointment.

BI-RADS CATEGORY  1: Negative.

## 2021-09-02 DIAGNOSIS — Z124 Encounter for screening for malignant neoplasm of cervix: Secondary | ICD-10-CM | POA: Diagnosis not present

## 2021-09-02 DIAGNOSIS — Z01419 Encounter for gynecological examination (general) (routine) without abnormal findings: Secondary | ICD-10-CM | POA: Diagnosis not present

## 2021-09-02 DIAGNOSIS — Z1151 Encounter for screening for human papillomavirus (HPV): Secondary | ICD-10-CM | POA: Diagnosis not present

## 2021-09-02 DIAGNOSIS — Z6824 Body mass index (BMI) 24.0-24.9, adult: Secondary | ICD-10-CM | POA: Diagnosis not present

## 2021-09-02 DIAGNOSIS — Z1231 Encounter for screening mammogram for malignant neoplasm of breast: Secondary | ICD-10-CM | POA: Diagnosis not present

## 2021-10-09 ENCOUNTER — Encounter: Payer: Self-pay | Admitting: Gastroenterology

## 2021-10-10 DIAGNOSIS — R109 Unspecified abdominal pain: Secondary | ICD-10-CM | POA: Diagnosis not present

## 2021-10-10 DIAGNOSIS — M545 Low back pain, unspecified: Secondary | ICD-10-CM | POA: Diagnosis not present

## 2021-10-23 ENCOUNTER — Ambulatory Visit: Payer: BC Managed Care – PPO | Admitting: Gastroenterology

## 2021-10-23 ENCOUNTER — Encounter: Payer: Self-pay | Admitting: Gastroenterology

## 2021-10-23 VITALS — BP 120/80 | HR 76 | Ht 65.0 in | Wt 143.0 lb

## 2021-10-23 DIAGNOSIS — R109 Unspecified abdominal pain: Secondary | ICD-10-CM | POA: Insufficient documentation

## 2021-10-23 DIAGNOSIS — R350 Frequency of micturition: Secondary | ICD-10-CM | POA: Diagnosis not present

## 2021-10-23 DIAGNOSIS — R194 Change in bowel habit: Secondary | ICD-10-CM | POA: Insufficient documentation

## 2021-10-23 NOTE — Patient Instructions (Signed)
You have been scheduled for a CT scan of the abdomen and pelvis at [facility] ([address]---this is in the same building as [employer]).  ? ?You are scheduled on Thursday 10/31/21 at 11:30 am. You should arrive 15 minutes prior to your appointment time for registration. Please follow the written instructions below on the day of your exam: ? ?WARNING: IF YOU ARE ALLERGIC TO IODINE/X-RAY DYE, PLEASE NOTIFY RADIOLOGY IMMEDIATELY AT [phone]! YOU WILL BE GIVEN A 13 HOUR PREMEDICATION PREP. ? ?1) Do not eat or drink anything after 7:30 am (4 hours prior to your test) ?2) You have been given 2 bottles of oral contrast to drink. The solution may taste better if refrigerated, but do NOT add ice or any other liquid to this solution. Shake well before drinking. ?  ? Drink 1 bottle of contrast @ 9:30 am (2 hours prior to your exam) ? Drink 1 bottle of contrast @ 10:30 am (1 hour prior to your exam) ? ?You may take any medications as prescribed with a small amount of water, if necessary. If you take any of the following medications: METFORMIN, GLUCOPHAGE, GLUCOVANCE, AVANDAMET, RIOMET, FORTAMET, ACTOPLUS MET, JANUMET, [place] or METAGLIP, you MAY be asked to HOLD this medication 48 hours AFTER the exam. ? ?The purpose of you drinking the oral contrast is to aid in the visualization of your intestinal tract. The contrast solution may cause some diarrhea. Depending on your individual set of symptoms, you may also receive an intravenous injection of x-ray contrast/dye. Plan on being at [facility] for 30 minutes or longer, depending on the type of exam you are having performed. ? ?This test typically takes 30-45 minutes to complete. ? ?If you have any questions regarding your exam or if you need to reschedule, you may call the CT department at [phone] between the hours of 8:00 am and 5:00 pm, Monday-Friday. ? ?____________________________________________________________________ ? ?If  you are age 51 or younger, your body mass index should be between 19-25. Your Body mass index is 23.8 kg/m?Marland Kitchen If this is out of the aformentioned range listed, please consider follow up with your Primary Care Provider.  ?________________________________________________________ ? ?The [facility] providers would like to encourage you to use [facility] to communicate with providers for non-urgent requests or questions.  Due to long hold times on the telephone, sending your provider a message by [facility] may be a faster and more efficient way to get a response.  Please allow 48 business hours for a response.  Please remember that this is for non-urgent requests.  ?_______________________________________________________ ? ? ?

## 2021-10-23 NOTE — Progress Notes (Signed)
? ? ? ?10/23/2021 ?Brettany Marko Stai ?[id number] ?Aug 26, 1970 ? ? ?HISTORY OF PRESENT ILLNESS: This is a 51 year old female who is a patient of Dr. Tarri Glenn, known to her for colonoscopy in July 2022 at which time it showed the following: ? ?- The entire examined colon is normal. ?- Non-bleeding external and internal hemorrhoids. ?- One 3 mm polyp at the splenic flexure, removed with a cold snare. Resected and retrieved. ?- The examination was otherwise normal on direct and retroflexion views. ? ?Tubular adenoma. ? ?She is here today with complaints of right-sided abdominal pain.  She says that actually back in September she may have started noticing it and she had urinary frequency.  She says that it has come and go since then, but really more so over the past several weeks.  She says this is not a severe pain, more just bothersome.  She says sometimes she will notice the increase in urinary frequency, but then other times it seems GI related with increased stool frequency of about 6-7 bowel movements a day.  Other times stools/BMs are normal.  She discussed with her PCP and they thought it was likely musculoskeletal.  CBC and CMP were fine.  UA showed small amount of blood so she is going to have that repeated with her PCP.  Says that she has also felt feverish at times, temp not over 100.  Also feels fatigued, which is not like her.  She denies any unintentional weight loss and says that her appetite is good. ? ?Past Medical History:  ?Diagnosis Date  ? BCC (basal cell carcinoma) 1992  ? R cheek tx with MOHS  ? Seasonal allergies   ? ?Past Surgical History:  ?Procedure Laterality Date  ? ADENOIDECTOMY  1978  ? INNER EAR SURGERY    ? MOHS SURGERY    ? around 2000  ? WISDOM TOOTH EXTRACTION    ? ? reports that she has never smoked. She has never used smokeless tobacco. She reports current alcohol use of about 4.0 standard drinks per week. She reports that she does not use drugs. ?family history includes Arthritis  in her paternal grandfather; Colon polyps (age of onset: 68) in her father; Diabetes in her maternal grandfather; Endometrial cancer in her mother; Heart attack (age of onset: 51) in her father; Hypertension in her brother and father; Kidney disease in her father; Lung cancer in her father and paternal aunt. ?No Known Allergies ? ?  ?Outpatient Encounter Medications as of 10/23/2021  ?Medication Sig  ? ascorbic acid (VITAMIN C) 1000 MG tablet Take 2 tablets by mouth daily at 6 (six) AM.  ? JUNEL 1/20 1-20 MG-MCG tablet Take 1 tablet by mouth daily.  ? loratadine (CLARITIN) 10 MG tablet Take 10 mg by mouth daily.  ? [DISCONTINUED] fluticasone (FLONASE) 50 MCG/ACT nasal spray Place into both nostrils daily. (Patient not taking: Reported on 01/23/2021)  ? [DISCONTINUED] pimecrolimus (ELIDEL) 1 % cream Apply to rash under arms BID PRN.  ? [DISCONTINUED] saccharomyces boulardii (FLORASTOR) 250 MG capsule Take 2 capsules by mouth daily at 6 (six) AM.  ? [DISCONTINUED] triamcinolone ointment (KENALOG) 0.1 % Apply to rash under arms BID up to one week and rash on the lower legs BID up to two weeks. (Patient not taking: Reported on 06/20/2021)  ? ?No facility-administered encounter medications on file as of 10/23/2021.  ? ? ? ?REVIEW OF SYSTEMS  : All other systems reviewed and negative except where noted in the History of Present Illness. ? ? ?  PHYSICAL EXAM: ?BP 120/80 (BP Location: Left Arm, Patient Position: Sitting, Cuff Size: Normal)   Pulse 76   Ht '5\' 5"'$  (1.651 m) Comment: height measured without shoes  Wt 143 lb (64.9 kg)   BMI 23.80 kg/m?  ?General: Well developed white female in no acute distress ?Head: Normocephalic and atraumatic ?Eyes:  Sclerae anicteric, conjunctiva pink. ?Ears: Normal auditory acuity ?Lungs: Clear throughout to auscultation; no W/R/R. ?Heart: Regular rate and rhythm; no M/R/G. ?Abdomen: Soft, non-distended.  BS present.  Mild right sided TTP. ?Musculoskeletal: Symmetrical with no gross  deformities  ?Skin: No lesions on visible extremities ?Extremities: No edema  ?Neurological: Alert oriented x 4, grossly non-focal ?Psychological:  Alert and cooperative. Normal mood and affect ? ?ASSESSMENT AND PLAN: ?*51 year old female with complaints of right sided abdominal pain sometimes with associated urinary frequency, but then sometimes seems to be associated with some GI symptoms including diarrhea.  Also reports feeling feverish at times, but temperature usually no greater than 100.  Also with fatigue.  Recent colonoscopy was fine.  Labs okay.  We will plan for CT scan of the abdomen and pelvis with contrast. ? ? ?CC:  Anna Late, MD ? ?  ?

## 2021-10-24 DIAGNOSIS — R3129 Other microscopic hematuria: Secondary | ICD-10-CM | POA: Diagnosis not present

## 2021-10-24 NOTE — Progress Notes (Signed)
Reviewed and agree with management plans. ? ?Anna Yohe L. Carvell Hoeffner, MD, MPH  ?

## 2021-10-31 ENCOUNTER — Ambulatory Visit (INDEPENDENT_AMBULATORY_CARE_PROVIDER_SITE_OTHER)
Admission: RE | Admit: 2021-10-31 | Discharge: 2021-10-31 | Disposition: A | Discharge status: Home / Self Care | Payer: BC Managed Care – PPO | Source: Ambulatory Visit | Attending: Gastroenterology | Admitting: Gastroenterology

## 2021-10-31 DIAGNOSIS — R109 Unspecified abdominal pain: Secondary | ICD-10-CM | POA: Diagnosis not present

## 2021-10-31 DIAGNOSIS — R194 Change in bowel habit: Secondary | ICD-10-CM

## 2021-10-31 DIAGNOSIS — R350 Frequency of micturition: Secondary | ICD-10-CM

## 2021-10-31 MED ORDER — IOHEXOL 300 MG/ML  SOLN
100.0000 mL | Freq: Once | INTRAMUSCULAR | Status: AC | PRN
  Administered 2021-10-31: 100 mL via INTRAVENOUS

## 2021-11-01 ENCOUNTER — Telehealth: Payer: Self-pay

## 2021-11-01 ENCOUNTER — Other Ambulatory Visit: Payer: Self-pay

## 2021-11-01 DIAGNOSIS — N289 Disorder of kidney and ureter, unspecified: Secondary | ICD-10-CM

## 2021-11-01 NOTE — Telephone Encounter (Signed)
Urgent amb referral placed to [facility]. Called [phone] and spoke office to schedule appt. Pt has been scheduled for urgent appt 11/05/21 @ 9am with Hollice Espy, MD. Called pt to inform about results and recommendations as reviewed and documented by Alonza Bogus, PA-C. LVM requesting returned call. Will continue efforts to reach pt. ?

## 2021-11-01 NOTE — Telephone Encounter (Signed)
Received urgent call from Radiology to provide report findings as follows: ? ?IMPRESSION: ?1. 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 Recommend urology consult. ?2. No evidence of renal tumor in vein or abdominal adenopathy. ?3. Moderate volume of formed stool throughout the colon suggestive ?of constipation. ? ?Routing this message to Alonza Bogus, PA-C for her to review and address. ?

## 2021-11-01 NOTE — Telephone Encounter (Signed)
Pt returned call. Informed of results and recommendations as reviewed and documented by Alonza Bogus, PA-C. Also informed about appt with Dr. Erlene Quan. States she did see the appt via My Chart. Provided with contact # should she need to reschedule appt. Verbalized acceptance and understanding. Per pt request, bowel purge instructions have been sent via My Chart. ?

## 2021-11-04 NOTE — Progress Notes (Signed)
? ?11/05/2021 ?3:39 PM  ? ?Anna Bauer ?1971/04/30 ?[id number] ? ?Referring provider:  ?Anna Late, MD ?[address] ?[facility] ?[place],  [place] [address] ?Chief Complaint  ?Patient presents with  ? Renal Mass  ? ? ? ?HPI: ?Anna Bauer is a 51 y.o.female who presents today for further evaluation of left renal lesion.  ? ?She was seen on 10/23/2021 by GI. At the time she had complaints of right-sided abdominal pain. She was scheduled to undergo a CT abdomen and pelvis w contrast on 10/31/2021. It visualized a partially exophytic complex heterogeneous left upper pole renal lesion measures up to 2.4 cm, highly suspicious for renal cell carcinoma. evidence of renal tumor in vein or abdominal adenopathy. Moderate volume of formed stool throughout the colon suggestive of constipation. ? ?She is asymptomatic she reports that she is having right  sided pain and urinary frequency.  She thinks her right-sided pain may be related to constipation. ? ?She denies any gross hematuria or dysuria. ? ?PMH: ?Past Medical History:  ?Diagnosis Date  ? BCC (basal cell carcinoma) 1992  ? R cheek tx with MOHS  ? Seasonal allergies   ? ? ?Surgical History: ?Past Surgical History:  ?Procedure Laterality Date  ? ADENOIDECTOMY  1978  ? INNER EAR SURGERY    ? MOHS SURGERY    ? around 2000  ? WISDOM TOOTH EXTRACTION    ? ? ?Home Medications:  ?Allergies as of 11/05/2021   ?No Known Allergies ?  ? ?  ?Medication List  ?  ? ?  ? Accurate as of November 05, 2021  3:39 PM. If you have any questions, ask your nurse or doctor.  ?  ?  ? ?  ? ?ascorbic acid 1000 MG tablet ?Commonly known as: VITAMIN C ?Take 2 tablets by mouth daily at 6 (six) AM. ?  ?Junel 1/20 1-20 MG-MCG tablet ?Generic drug: norethindrone-ethinyl estradiol ?Take 1 tablet by mouth daily. ?  ?loratadine 10 MG tablet ?Commonly known as: CLARITIN ?Take 10 mg by mouth daily. ?  ? ?  ? ? ?Allergies:  ?No Known  Allergies ? ?Family History: ?Family History  ?Problem Relation Age of Onset  ? Endometrial cancer Mother   ? Colon polyps Father 41  ? Hypertension Father   ? Heart attack Father 23  ? Lung cancer Father   ?     tobacco  ? Kidney disease Father   ? Hypertension Brother   ? Lung cancer Paternal Aunt   ? Diabetes Maternal Grandfather   ? Arthritis Paternal Grandfather   ? Colitis Neg Hx   ? Colon cancer Neg Hx   ? Esophageal cancer Neg Hx   ? Rectal cancer Neg Hx   ? Stomach cancer Neg Hx   ? ? ?Social History:  reports that she has never smoked. She has never used smokeless tobacco. She reports current alcohol use of about 4.0 standard drinks per week. She reports that she does not use drugs. ? ? ?Physical Exam: ?BP (!) 131/92   Pulse 75   Ht '5\' 5"'$  (1.651 m)   Wt 140 lb (63.5 kg)   BMI 23.30 kg/m?   ?Constitutional:  Alert and oriented, No acute distress. ?HEENT: [place] AT, moist mucus membranes.  Trachea midline, no masses. ?Cardiovascular: No clubbing, cyanosis, or edema. ?Respiratory: Normal respiratory effort, no increased work of breathing. ?Skin: No rashes, bruises or suspicious lesions. ?Neurologic: Grossly intact, no focal deficits, moving all 4  extremities. ?Psychiatric: Normal mood and affect. ? ?Laboratory Data: ?Lab Results  ?Component Value Date  ? CREATININE 0.80 06/08/2019  ? ?No results found for: HGBA1C ? ?Urinalysis ?6-10 WBCs, 3-10 RBCs ? ?Pertinent Imaging: ?CLINICAL DATA:  Right-sided abdominal pain, urinary frequency, ?altered bowel habits x6 months. Normal colonoscopy. ?  ?EXAM: ?CT ABDOMEN AND PELVIS WITH CONTRAST ?  ?TECHNIQUE: ?Multidetector CT imaging of the abdomen and pelvis was performed ?using the standard protocol following bolus administration of ?intravenous contrast. ?  ?RADIATION DOSE REDUCTION: This exam was performed according to the ?departmental dose-optimization program which includes automated ?exposure control, adjustment of the mA and/or kV according to ?patient size  and/or use of iterative reconstruction technique. ?  ?CONTRAST:  166m OMNIPAQUE IOHEXOL 300 MG/ML  SOLN ?  ?COMPARISON:  None. ?  ?FINDINGS: ?Lower chest: No acute abnormality. ?  ?Hepatobiliary: No suspicious hepatic lesion. Gallbladder is ?unremarkable. No biliary ductal dilation. ?  ?Pancreas: No pancreatic ductal dilation or evidence of acute ?inflammation. ?  ?Spleen: No splenomegaly. ?  ?Adrenals/Urinary Tract: Bilateral adrenal glands appear normal. ?  ?No hydronephrosis. ?  ?Partially exophytic complex heterogeneous left upper pole renal ?lesion measures 2.4 x 2.3 cm on image 14/2, highly suspicious for ?renal cell carcinoma. ?  ?Urinary bladder is unremarkable for degree of distension. ?  ?Stomach/Bowel: Radiopaque enteric contrast material traverses the ?sigmoid colon. Stomach is unremarkable for degree of distension. No ?pathologic dilation of small or large bowel. Normal retrocecal ?appendix. Terminal ileum appears normal. Moderate volume of formed ?stool throughout the colon suggestive of constipation. No evidence ?of acute bowel inflammation. ?  ?Vascular/Lymphatic: No abdominal aortic aneurysm. No pathologically ?enlarged abdominal or pelvic lymph nodes. ?  ?Reproductive: Uterus and bilateral adnexa are unremarkable. ?  ?Other: No significant abdominopelvic free fluid. ?  ?Musculoskeletal: No acute osseous abnormality. ?  ?IMPRESSION: ?1. 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 Recommend urology consult. ?2. No evidence of renal tumor in vein or abdominal adenopathy. ?3. Moderate volume of formed stool throughout the colon suggestive ?of constipation. ?  ?These results will be called to the ordering clinician or ?representative by the Radiologist Assistant, and communication ?documented in the PACS or [employer] ?  ?  ?Electronically Signed ?  By: JDahlia BailiffM.D. ?  On: 11/01/2021  09:29 ? ?CT scan was personally reviewed, agree with radiologic interpretation.  Left upper pole renal mass enhancing in close proximity to the spleen. ? ?Assessment & Plan:   ? ?Renal mass  ?-  Measures up to 2.4 cm ?- A solid renal mass raises the suspicion of primary renal malignancy.  We discussed this in detail and in regards to the spectrum of renal masses which includes cysts (pure cysts are considered benign), solid masses and everything in between. The risk of metastasis increases as the size of solid renal mass increases. In general, it is believed that the risk of metastasis for renal masses less than 3-4 cm is small (up to approximately 5%) based mainly on large retrospective studies. ?In some cases and especially in patients of older age and multiple comorbidities a surveillance approach may be appropriate. The treatment of solid renal masses includes: surveillance, cryoablation (percutaneous and laparoscopic) in addition to partial and complete nephrectomy (each with option of laparoscopic, robotic and open depending on appropriateness). Furthermore, nephrectomy appears to be an independent risk factor for the development of chronic kidney disease suggesting that nephron sparing approaches  should be implored whenever feasible. We reviewed these options in context of the patients current situation as well as the pros and cons of each. ?-Gold standard for young healthy patient with the surgery but she is also interested in the option of cryoablation.  We will plan for referral to interventional radiology as well as a second opinion at [facility] urology for robotic partial nephrectomy by Dr. Tresa Moore or by Dr. Alinda Money given the more complex extreme upper pole location. ? ?2. Possible UTI ?- UA suspicious today  ?- Symptoms; urinary frequency  ?- Will send for culture to rule out any underlying infection ?  ?3. Microscopic hematuria  ?- May be related to #2  ?- if she does not have improvement would recommend  cystoscopy in the OR during cryotherapy or in office to further evaluate  ? ? ?Conley Rolls as a scribe for Hollice Espy, MD.,have documented all relevant documentation on the behalf of Hollice Espy, MD,as directed by  Lia Foyer

## 2021-11-05 ENCOUNTER — Ambulatory Visit: Payer: BC Managed Care – PPO | Admitting: Urology

## 2021-11-05 ENCOUNTER — Encounter: Payer: Self-pay | Admitting: Urology

## 2021-11-05 VITALS — BP 131/92 | HR 75 | Ht 65.0 in | Wt 140.0 lb

## 2021-11-05 DIAGNOSIS — R3129 Other microscopic hematuria: Secondary | ICD-10-CM

## 2021-11-05 DIAGNOSIS — R350 Frequency of micturition: Secondary | ICD-10-CM | POA: Diagnosis not present

## 2021-11-05 DIAGNOSIS — N2889 Other specified disorders of kidney and ureter: Secondary | ICD-10-CM

## 2021-11-05 LAB — URINALYSIS, COMPLETE
Bilirubin, UA: NEGATIVE
Glucose, UA: NEGATIVE
Ketones, UA: NEGATIVE
Nitrite, UA: NEGATIVE
Specific Gravity, UA: 1.02 (ref 1.005–1.030)
Urobilinogen, Ur: 0.2 mg/dL (ref 0.2–1.0)
pH, UA: 6.5 (ref 5.0–7.5)

## 2021-11-05 LAB — MICROSCOPIC EXAMINATION: Bacteria, UA: NONE SEEN

## 2021-11-05 NOTE — Patient Instructions (Signed)
Dr. Raynelle Bring ?Dr. Lynne Logan ?

## 2021-11-06 ENCOUNTER — Encounter: Payer: Self-pay | Admitting: Urology

## 2021-11-08 LAB — CULTURE, URINE COMPREHENSIVE

## 2021-11-15 ENCOUNTER — Telehealth: Payer: Self-pay

## 2021-11-15 NOTE — Telephone Encounter (Signed)
IR referral placed for Renal Mass, Message sent to [facility]  Radiology. She will contact patient to schedule ?

## 2021-11-18 ENCOUNTER — Other Ambulatory Visit: Payer: Self-pay | Admitting: Urology

## 2021-11-18 DIAGNOSIS — N2889 Other specified disorders of kidney and ureter: Secondary | ICD-10-CM

## 2021-12-02 DIAGNOSIS — R3915 Urgency of urination: Secondary | ICD-10-CM | POA: Diagnosis not present

## 2021-12-02 DIAGNOSIS — C642 Malignant neoplasm of left kidney, except renal pelvis: Secondary | ICD-10-CM | POA: Diagnosis not present

## 2021-12-03 ENCOUNTER — Other Ambulatory Visit: Payer: Self-pay | Admitting: Urology

## 2021-12-17 NOTE — Patient Instructions (Addendum)
DUE TO COVID-19 ONLY TWO VISITORS  (aged 51 and older)  IS ALLOWED TO COME WITH YOU AND STAY IN THE WAITING ROOM ONLY DURING PRE OP AND PROCEDURE.   **NO VISITORS ARE ALLOWED IN THE SHORT STAY AREA OR RECOVERY ROOM!!**  IF YOU WILL BE ADMITTED INTO THE HOSPITAL YOU ARE ALLOWED ONLY FOUR SUPPORT PEOPLE DURING VISITATION HOURS ONLY (7 AM -8PM)   The support person(s) must pass our screening, gel in and out Visitors GUEST BADGE MUST BE WORN VISIBLY  One adult visitor may remain with you overnight and MUST be in the room by 8 P.M.   You are not required to [employer] often Do NOT share personal items Notify your provider if you are in close contact with someone who has COVID or you develop fever 100.4 or greater, new onset of sneezing, cough, sore throat, shortness of breath or body aches.        Your procedure is scheduled on:  01-01-22   Report to [facility] Main Entrance    Report to admitting at 6:15 AM   Call this number if you have problems the morning of surgery [phone]   Follow a clear liquid diet day before surgery    Do not eat solids or drink liquids after midnight  Clear Liquid Diet Water Black Coffee (sugar ok, NO MILK/CREAM OR CREAMERS)  Tea (sugar ok, NO MILK/CREAM OR CREAMERS) regular and decaf                             Plain Jell-O (NO RED)                                           Fruit ices (not with fruit pulp, NO RED)                                     Popsicles (NO RED)                                                                  Juice: apple, WHITE grape, WHITE cranberry Sports drinks like Gatorade (NO RED) Clear broth(vegetable,chicken,beef)                     If you have questions, please contact your surgeon's office.   FOLLOW BOWEL PREP AND ANY ADDITIONAL PRE OP INSTRUCTIONS YOU RECEIVED FROM YOUR SURGEON'S OFFICE!!!    Miralax 255 g - at 4 PM the day before surgery mix contents of container with 64 ounces  Gatorade  Drink one glass every 15-30 minutes until finished   Oral Hygiene is also important to reduce your risk of infection.                                    Remember - BRUSH YOUR TEETH THE MORNING OF SURGERY WITH YOUR REGULAR TOOTHPASTE   Do NOT smoke after Midnight   Take these medicines the morning  of surgery with A SIP OF WATER: Junel, Claritin                              You may not have any metal on your body including hair pins, jewelry, and body piercing             Do not wear make-up, lotions, powders, perfumes or deodorant  Do not wear nail polish including gel and S&S, artificial/acrylic nails, or any other type of covering on natural nails including finger and toenails. If you have artificial nails, gel coating, etc. that needs to be removed by a nail salon please have this removed prior to surgery or surgery may need to be canceled/ delayed if the surgeon/ anesthesia feels like they are unable to be safely monitored.   Do not shave  48 hours prior to surgery.           Contacts, dentures or bridgework may not be worn into surgery.   Bring small overnight bag day of surgery.  Do not bring valuables to the hospital. [place].  Please read over the following fact sheets you were given: IF YOU HAVE QUESTIONS ABOUT YOUR PRE-OP INSTRUCTIONS PLEASE CALL [facility] - Preparing for Surgery Before surgery, you can play an important role.  Because skin is not sterile, your skin needs to be as free of germs as possible.  You can reduce the number of germs on your skin by washing with CHG (chlorahexidine gluconate) soap before surgery.  CHG is an antiseptic cleaner which kills germs and bonds with the skin to continue killing germs even after washing. Please DO NOT use if you have an allergy to CHG or antibacterial soaps.  If your skin becomes reddened/irritated stop using the CHG and inform your nurse when you arrive at  Short Stay. Do not shave (including legs and underarms) for at least 48 hours prior to the first CHG shower.  You may shave your face/neck.  Please follow these instructions carefully:  1.  Shower with CHG Soap the night before surgery and the  morning of surgery.  2.  If you choose to wash your hair, wash your hair first as usual with your normal  shampoo.  3.  After you shampoo, rinse your hair and body thoroughly to remove the shampoo.                             4.  Use CHG as you would any other liquid soap.  You can apply chg directly to the skin and wash.  Gently with a scrungie or clean washcloth.  5.  Apply the CHG Soap to your body ONLY FROM THE NECK DOWN.   Do   not use on face/ open                           Wound or open sores. Avoid contact with eyes, ears mouth and   genitals (private parts).                       Wash face,  Genitals (private parts) with your normal soap.             6.  Wash thoroughly, paying special attention to the area where your  surgery  will be performed.  7.  Thoroughly rinse your body with warm water from the neck down.  8.  DO NOT shower/wash with your normal soap after using and rinsing off the CHG Soap.                9.  Pat yourself dry with a clean towel.            10.  Wear clean pajamas.            11.  Place clean sheets on your bed the night of your first shower and do not  sleep with pets. Day of Surgery : Do not apply any lotions/deodorants the morning of surgery.  Please wear clean clothes to the hospital/surgery center.  FAILURE TO FOLLOW THESE INSTRUCTIONS MAY RESULT IN THE CANCELLATION OF YOUR SURGERY  PATIENT SIGNATURE_________________________________  NURSE SIGNATURE__________________________________  ________________________________________________________________________    WHAT IS A BLOOD TRANSFUSION? Blood Transfusion Information  A transfusion is the replacement of blood or some of its parts. Blood is made up of  multiple cells which provide different functions. Red blood cells carry oxygen and are used for blood loss replacement. White blood cells fight against infection. Platelets control bleeding. Plasma helps clot blood. Other blood products are available for specialized needs, such as hemophilia or other clotting disorders. BEFORE THE TRANSFUSION  Who gives blood for transfusions?  Healthy volunteers who are fully evaluated to make sure their blood is safe. This is blood bank blood. Transfusion therapy is the safest it has ever been in the practice of medicine. Before blood is taken from a donor, a complete history is taken to make sure that person has no history of diseases nor engages in risky social behavior (examples are intravenous drug use or sexual activity with multiple partners). The donor's travel history is screened to minimize risk of transmitting infections, such as malaria. The donated blood is tested for signs of infectious diseases, such as HIV and hepatitis. The blood is then tested to be sure it is compatible with you in order to minimize the chance of a transfusion reaction. If you or a relative donates blood, this is often done in anticipation of surgery and is not appropriate for emergency situations. It takes many days to process the donated blood. RISKS AND COMPLICATIONS Although transfusion therapy is very safe and saves many lives, the main dangers of transfusion include:  Getting an infectious disease. Developing a transfusion reaction. This is an allergic reaction to something in the blood you were given. Every precaution is taken to prevent this. The decision to have a blood transfusion has been considered carefully by your caregiver before blood is given. Blood is not given unless the benefits outweigh the risks. AFTER THE TRANSFUSION Right after receiving a blood transfusion, you will usually feel much better and more energetic. This is especially true if your red blood  cells have gotten low (anemic). The transfusion raises the level of the red blood cells which carry oxygen, and this usually causes an energy increase. The nurse administering the transfusion will monitor you carefully for complications. HOME CARE INSTRUCTIONS  No special instructions are needed after a transfusion. You may find your energy is better. Speak with your caregiver about any limitations on activity for underlying diseases you may have. SEEK MEDICAL CARE IF:  Your condition is not improving after your transfusion. You develop redness or irritation at the intravenous (IV) site. SEEK IMMEDIATE MEDICAL CARE IF:  Any of the following  symptoms occur over the next 12 hours: Shaking chills. You have a temperature by mouth above 102 F (38.9 C), not controlled by medicine. Chest, back, or muscle pain. People around you feel you are not acting correctly or are confused. Shortness of breath or difficulty breathing. Dizziness and fainting. You get a rash or develop hives. You have a decrease in urine output. Your urine turns a dark color or changes to pink, red, or brown. Any of the following symptoms occur over the next 10 days: You have a temperature by mouth above 102 F (38.9 C), not controlled by medicine. Shortness of breath. Weakness after normal activity. The white part of the eye turns yellow (jaundice). You have a decrease in the amount of urine or are urinating less often. Your urine turns a dark color or changes to pink, red, or brown. Document Released: 06/27/2000 Document Revised: 09/22/2011 Document Reviewed: 02/14/2008 [facility] Patient Information 2014 [place], [place].  _______________________________________________________________________

## 2021-12-26 ENCOUNTER — Encounter (HOSPITAL_COMMUNITY): Payer: Self-pay

## 2021-12-26 ENCOUNTER — Encounter (HOSPITAL_COMMUNITY)
Admission: RE | Admit: 2021-12-26 | Discharge: 2021-12-26 | Disposition: A | Discharge status: Home / Self Care | Payer: BC Managed Care – PPO | Source: Ambulatory Visit | Attending: Urology | Admitting: Urology

## 2021-12-26 ENCOUNTER — Other Ambulatory Visit: Payer: Self-pay

## 2021-12-26 VITALS — BP 137/99 | HR 72 | Temp 97.9°F | Resp 14 | Ht 65.0 in | Wt 140.4 lb

## 2021-12-26 DIAGNOSIS — Z01812 Encounter for preprocedural laboratory examination: Secondary | ICD-10-CM | POA: Insufficient documentation

## 2021-12-26 DIAGNOSIS — Z01818 Encounter for other preprocedural examination: Secondary | ICD-10-CM

## 2021-12-26 LAB — CBC
HCT: 42.1 % (ref 36.0–46.0)
Hemoglobin: 13.9 g/dL (ref 12.0–15.0)
MCH: 32.3 pg (ref 26.0–34.0)
MCHC: 33 g/dL (ref 30.0–36.0)
MCV: 97.7 fL (ref 80.0–100.0)
Platelets: 291 10*3/uL (ref 150–400)
RBC: 4.31 MIL/uL (ref 3.87–5.11)
RDW: 12.4 % (ref 11.5–15.5)
WBC: 6.6 10*3/uL (ref 4.0–10.5)
nRBC: 0 % (ref 0.0–0.2)

## 2021-12-26 NOTE — Progress Notes (Signed)
COVID Vaccine Completed:  Yes  Date of COVID positive in last 90 days:  No  PCP - Derinda Late, MD Cardiologist - N/A  Chest x-ray - N/A EKG - N/A Stress Test - N/A ECHO - N/A Cardiac Cath - N/A Pacemaker/ICD device last checked: Spinal Cord Stimulator:  Bowel Prep - Clear liquids day before surgery and Miralax.  Patient has instructions  Sleep Study - N/A CPAP -   Fasting Blood Sugar - N/A Checks Blood Sugar _____ times a day  Blood Thinner Instructions:  N/A Aspirin Instructions: Last Dose:  Activity level:   Can go up a flight of stairs and perform activities of daily living without stopping and without symptoms of chest pain or shortness of breath.  Able to exercise without symptoms  Anesthesia review: N/A  Patient denies shortness of breath, fever, cough and chest pain at PAT appointment  Patient verbalized understanding of instructions that were given to them at the PAT appointment. Patient was also instructed that they will need to review over the PAT instructions again at home before surgery.

## 2021-12-31 NOTE — Anesthesia Preprocedure Evaluation (Signed)
Anesthesia Evaluation  Patient identified by MRN, date of birth, ID band Patient awake    Reviewed: Allergy & Precautions, NPO status , Patient's Chart, lab work & pertinent test results  Airway Mallampati: III  TM Distance: >3 FB Neck ROM: Full    Dental no notable dental hx.    Pulmonary neg pulmonary ROS,    Pulmonary exam normal        Cardiovascular negative cardio ROS Normal cardiovascular exam     Neuro/Psych negative neurological ROS  negative psych ROS   GI/Hepatic negative GI ROS, (+)     substance abuse  ,   Endo/Other  negative endocrine ROS  Renal/GU      Musculoskeletal negative musculoskeletal ROS (+)   Abdominal   Peds  Hematology negative hematology ROS (+)   Anesthesia Other Findings LEFT RENAL MASS  Reproductive/Obstetrics                            Anesthesia Physical Anesthesia Plan  ASA: 2  Anesthesia Plan: General   Post-op Pain Management:    Induction: Intravenous  PONV Risk Score and Plan: 3 and Ondansetron, Dexamethasone, Midazolam and Treatment may vary due to age or medical condition  Airway Management Planned: Oral ETT  Additional Equipment:   Intra-op Plan:   Post-operative Plan: Extubation in OR  Informed Consent: I have reviewed the patients History and Physical, chart, labs and discussed the procedure including the risks, benefits and alternatives for the proposed anesthesia with the patient or authorized representative who has indicated his/her understanding and acceptance.     Dental advisory given  Plan Discussed with: CRNA  Anesthesia Plan Comments:        Anesthesia Quick Evaluation

## 2022-01-01 ENCOUNTER — Other Ambulatory Visit: Payer: Self-pay

## 2022-01-01 ENCOUNTER — Ambulatory Visit (HOSPITAL_COMMUNITY): Payer: BC Managed Care – PPO | Admitting: Anesthesiology

## 2022-01-01 ENCOUNTER — Encounter (HOSPITAL_COMMUNITY)
Admission: RE | Disposition: A | Discharge status: Home / Self Care | Payer: Self-pay | Source: Ambulatory Visit | Attending: Urology

## 2022-01-01 ENCOUNTER — Encounter (HOSPITAL_COMMUNITY): Payer: Self-pay | Admitting: Urology

## 2022-01-01 ENCOUNTER — Observation Stay (HOSPITAL_COMMUNITY)
Admission: RE | Admit: 2022-01-01 | Discharge: 2022-01-02 | Disposition: A | Discharge status: Home / Self Care | Payer: BC Managed Care – PPO | Source: Ambulatory Visit | Attending: Urology | Admitting: Urology

## 2022-01-01 DIAGNOSIS — N2889 Other specified disorders of kidney and ureter: Secondary | ICD-10-CM | POA: Diagnosis not present

## 2022-01-01 DIAGNOSIS — C642 Malignant neoplasm of left kidney, except renal pelvis: Secondary | ICD-10-CM | POA: Diagnosis not present

## 2022-01-01 DIAGNOSIS — Z01818 Encounter for other preprocedural examination: Secondary | ICD-10-CM

## 2022-01-01 DIAGNOSIS — Z79899 Other long term (current) drug therapy: Secondary | ICD-10-CM | POA: Diagnosis not present

## 2022-01-01 DIAGNOSIS — Z85828 Personal history of other malignant neoplasm of skin: Secondary | ICD-10-CM | POA: Diagnosis not present

## 2022-01-01 LAB — HEMOGLOBIN AND HEMATOCRIT, BLOOD
HCT: 40.5 % (ref 36.0–46.0)
Hemoglobin: 12.9 g/dL (ref 12.0–15.0)

## 2022-01-01 LAB — PREGNANCY, URINE: Preg Test, Ur: NEGATIVE

## 2022-01-01 LAB — TYPE AND SCREEN
ABO/RH(D): A POS
Antibody Screen: NEGATIVE

## 2022-01-01 LAB — ABO/RH: ABO/RH(D): A POS

## 2022-01-01 SURGERY — NEPHRECTOMY, PARTIAL, ROBOT-ASSISTED
Anesthesia: General | Laterality: Left

## 2022-01-01 MED ORDER — ONDANSETRON HCL 4 MG/2ML IJ SOLN
INTRAMUSCULAR | Status: DC | PRN
  Administered 2022-01-01: 4 mg via INTRAVENOUS

## 2022-01-01 MED ORDER — ROCURONIUM BROMIDE 10 MG/ML (PF) SYRINGE
PREFILLED_SYRINGE | INTRAVENOUS | Status: DC | PRN
  Administered 2022-01-01: 20 mg via INTRAVENOUS
  Administered 2022-01-01: 70 mg via INTRAVENOUS

## 2022-01-01 MED ORDER — EPHEDRINE SULFATE-NACL 50-0.9 MG/10ML-% IV SOSY
PREFILLED_SYRINGE | INTRAVENOUS | Status: DC | PRN
  Administered 2022-01-01: 10 mg via INTRAVENOUS

## 2022-01-01 MED ORDER — FENTANYL CITRATE PF 50 MCG/ML IJ SOSY
25.0000 ug | PREFILLED_SYRINGE | INTRAMUSCULAR | Status: DC | PRN
  Administered 2022-01-01: 50 ug via INTRAVENOUS

## 2022-01-01 MED ORDER — OXYCODONE HCL 5 MG PO TABS
5.0000 mg | ORAL_TABLET | Freq: Four times a day (QID) | ORAL | Status: DC | PRN
  Administered 2022-01-01: 10 mg via ORAL
  Administered 2022-01-02 (×2): 5 mg via ORAL
  Filled 2022-01-01: qty 2
  Filled 2022-01-01 (×2): qty 1

## 2022-01-01 MED ORDER — HYDROCODONE-ACETAMINOPHEN 5-325 MG PO TABS: 1.0000 | ORAL_TABLET | Freq: Four times a day (QID) | ORAL | 0 refills | Status: DC | PRN

## 2022-01-01 MED ORDER — DIPHENHYDRAMINE HCL 25 MG PO CAPS: 25.0000 mg | ORAL_CAPSULE | Freq: Four times a day (QID) | ORAL | Status: DC | PRN

## 2022-01-01 MED ORDER — PROPOFOL 10 MG/ML IV BOLUS
INTRAVENOUS | Status: DC | PRN
  Administered 2022-01-01: 60 mg via INTRAVENOUS
  Administered 2022-01-01: 140 mg via INTRAVENOUS

## 2022-01-01 MED ORDER — FENTANYL CITRATE (PF) 100 MCG/2ML IJ SOLN
INTRAMUSCULAR | Status: DC | PRN
  Administered 2022-01-01 (×3): 50 ug via INTRAVENOUS
  Administered 2022-01-01: 100 ug via INTRAVENOUS

## 2022-01-01 MED ORDER — SODIUM CHLORIDE 0.9% FLUSH
INTRAVENOUS | Status: DC | PRN
  Administered 2022-01-01: 20 mL

## 2022-01-01 MED ORDER — BUPIVACAINE LIPOSOME 1.3 % IJ SUSP
INTRAMUSCULAR | Status: DC | PRN
  Administered 2022-01-01: 20 mL

## 2022-01-01 MED ORDER — PROPOFOL 10 MG/ML IV BOLUS
INTRAVENOUS | Status: AC
  Filled 2022-01-01: qty 20

## 2022-01-01 MED ORDER — ORAL CARE MOUTH RINSE: 15.0000 mL | Freq: Once | OROMUCOSAL | Status: AC

## 2022-01-01 MED ORDER — LACTATED RINGERS IV SOLN: INTRAVENOUS | Status: DC

## 2022-01-01 MED ORDER — BUPIVACAINE LIPOSOME 1.3 % IJ SUSP
INTRAMUSCULAR | Status: AC
Start: 2022-01-01 — End: ?
  Filled 2022-01-01: qty 20

## 2022-01-01 MED ORDER — SURGIFLO WITH THROMBIN (HEMOSTATIC MATRIX KIT) OPTIME
TOPICAL | Status: DC | PRN
  Administered 2022-01-01: 1 via TOPICAL

## 2022-01-01 MED ORDER — LACTATED RINGERS IR SOLN
Status: DC | PRN
  Administered 2022-01-01: 1

## 2022-01-01 MED ORDER — DEXAMETHASONE SODIUM PHOSPHATE 10 MG/ML IJ SOLN
INTRAMUSCULAR | Status: DC | PRN
  Administered 2022-01-01: 10 mg via INTRAVENOUS

## 2022-01-01 MED ORDER — OXYCODONE HCL 5 MG/5ML PO SOLN: 5.0000 mg | Freq: Once | ORAL | Status: DC | PRN

## 2022-01-01 MED ORDER — PHENYLEPHRINE 80 MCG/ML (10ML) SYRINGE FOR IV PUSH (FOR BLOOD PRESSURE SUPPORT)
PREFILLED_SYRINGE | INTRAVENOUS | Status: DC | PRN
  Administered 2022-01-01: 160 ug via INTRAVENOUS
  Administered 2022-01-01: 200 ug via INTRAVENOUS

## 2022-01-01 MED ORDER — NORETHINDRONE ACET-ETHINYL EST 1-20 MG-MCG PO TABS
1.0000 | ORAL_TABLET | Freq: Every day | ORAL | Status: DC
  Administered 2022-01-01: 1 via ORAL

## 2022-01-01 MED ORDER — CHLORHEXIDINE GLUCONATE 0.12 % MT SOLN
15.0000 mL | Freq: Once | OROMUCOSAL | Status: AC
  Administered 2022-01-01: 15 mL via OROMUCOSAL

## 2022-01-01 MED ORDER — LIDOCAINE 2% (20 MG/ML) 5 ML SYRINGE
INTRAMUSCULAR | Status: DC | PRN
  Administered 2022-01-01: 60 mg via INTRAVENOUS

## 2022-01-01 MED ORDER — STERILE WATER FOR IRRIGATION IR SOLN
Status: DC | PRN
  Administered 2022-01-01: 1000 mL

## 2022-01-01 MED ORDER — SODIUM CHLORIDE (PF) 0.9 % IJ SOLN
INTRAMUSCULAR | Status: AC
  Filled 2022-01-01: qty 20

## 2022-01-01 MED ORDER — MIDAZOLAM HCL 2 MG/2ML IJ SOLN
INTRAMUSCULAR | Status: AC
  Filled 2022-01-01: qty 2

## 2022-01-01 MED ORDER — POLYETHYLENE GLYCOL 3350 17 GM/SCOOP PO POWD: 1.0000 | Freq: Once | ORAL | Status: DC

## 2022-01-01 MED ORDER — HYDROMORPHONE HCL 1 MG/ML IJ SOLN
0.5000 mg | INTRAMUSCULAR | Status: DC | PRN
  Administered 2022-01-01: 0.5 mg via INTRAVENOUS
  Filled 2022-01-01: qty 0.5

## 2022-01-01 MED ORDER — FENTANYL CITRATE PF 50 MCG/ML IJ SOSY
PREFILLED_SYRINGE | INTRAMUSCULAR | Status: AC
  Administered 2022-01-01: 50 ug via INTRAVENOUS
  Filled 2022-01-01: qty 1

## 2022-01-01 MED ORDER — ONDANSETRON HCL 4 MG/2ML IJ SOLN
INTRAMUSCULAR | Status: AC
  Filled 2022-01-01: qty 2

## 2022-01-01 MED ORDER — DOCUSATE SODIUM 100 MG PO CAPS: 100.0000 mg | ORAL_CAPSULE | Freq: Two times a day (BID) | ORAL | Status: DC

## 2022-01-01 MED ORDER — SUGAMMADEX SODIUM 200 MG/2ML IV SOLN
INTRAVENOUS | Status: DC | PRN
  Administered 2022-01-01: 150 mg via INTRAVENOUS

## 2022-01-01 MED ORDER — OXYCODONE HCL 5 MG PO TABS: 5.0000 mg | ORAL_TABLET | Freq: Once | ORAL | Status: DC | PRN

## 2022-01-01 MED ORDER — ORAL CARE MOUTH RINSE: 15.0000 mL | OROMUCOSAL | Status: DC | PRN

## 2022-01-01 MED ORDER — MIDAZOLAM HCL 5 MG/5ML IJ SOLN
INTRAMUSCULAR | Status: DC | PRN
  Administered 2022-01-01: 2 mg via INTRAVENOUS

## 2022-01-01 MED ORDER — CEFAZOLIN SODIUM-DEXTROSE 2-4 GM/100ML-% IV SOLN
2.0000 g | INTRAVENOUS | Status: AC
  Administered 2022-01-01: 2 g via INTRAVENOUS
  Filled 2022-01-01: qty 100

## 2022-01-01 MED ORDER — SCOPOLAMINE 1 MG/3DAYS TD PT72
1.0000 | MEDICATED_PATCH | TRANSDERMAL | Status: DC
Start: 2022-01-01 — End: 2022-01-01

## 2022-01-01 MED ORDER — FENTANYL CITRATE PF 50 MCG/ML IJ SOSY
PREFILLED_SYRINGE | INTRAMUSCULAR | Status: AC
  Administered 2022-01-01: 50 ug via INTRAVENOUS
  Filled 2022-01-01: qty 2

## 2022-01-01 MED ORDER — FENTANYL CITRATE (PF) 250 MCG/5ML IJ SOLN
INTRAMUSCULAR | Status: AC
  Filled 2022-01-01: qty 5

## 2022-01-01 MED ORDER — AMISULPRIDE (ANTIEMETIC) 5 MG/2ML IV SOLN: 10.0000 mg | Freq: Once | INTRAVENOUS | Status: DC | PRN

## 2022-01-01 MED ORDER — ACETAMINOPHEN 500 MG PO TABS
1000.0000 mg | ORAL_TABLET | Freq: Once | ORAL | Status: AC
  Administered 2022-01-01: 1000 mg via ORAL
  Filled 2022-01-01: qty 2

## 2022-01-01 MED ORDER — ACETAMINOPHEN 325 MG PO TABS
650.0000 mg | ORAL_TABLET | Freq: Four times a day (QID) | ORAL | Status: DC
  Administered 2022-01-01 – 2022-01-02 (×4): 650 mg via ORAL
  Filled 2022-01-01 (×4): qty 2

## 2022-01-01 SURGICAL SUPPLY — 83 items
ADH SKN CLS APL DERMABOND .7 (GAUZE/BANDAGES/DRESSINGS) ×1
AGENT HMST KT MTR STRL THRMB (HEMOSTASIS) ×2
APL ESCP 34 STRL LF DISP (HEMOSTASIS) ×2
APL PRP STRL LF DISP 70% ISPRP (MISCELLANEOUS) ×1
APPLICATOR SURGIFLO ENDO (HEMOSTASIS) ×4 IMPLANT
BAG COUNTER SPONGE SURGICOUNT (BAG) IMPLANT
BAG SPNG CNTER NS LX DISP (BAG)
CHLORAPREP W/TINT 26 (MISCELLANEOUS) ×3 IMPLANT
CLIP LIGATING HEM O LOK PURPLE (MISCELLANEOUS) ×6 IMPLANT
CLIP LIGATING HEMO LOK XL GOLD (MISCELLANEOUS) IMPLANT
CLIP LIGATING HEMO O LOK GREEN (MISCELLANEOUS) ×7 IMPLANT
CLIP SUT LAPRA TY ABSORB (SUTURE) ×6 IMPLANT
COVER SURGICAL LIGHT HANDLE (MISCELLANEOUS) ×3 IMPLANT
COVER TIP SHEARS 8 DVNC (MISCELLANEOUS) ×2 IMPLANT
COVER TIP SHEARS 8MM DA VINCI (MISCELLANEOUS) ×2
CUTTER ECHEON FLEX ENDO 45 340 (ENDOMECHANICALS) IMPLANT
DERMABOND ADVANCED (GAUZE/BANDAGES/DRESSINGS) ×1
DERMABOND ADVANCED .7 DNX12 (GAUZE/BANDAGES/DRESSINGS) ×2 IMPLANT
DRAIN CHANNEL 15F RND FF 3/16 (WOUND CARE) ×3 IMPLANT
DRAPE ARM DVNC X/XI (DISPOSABLE) ×8 IMPLANT
DRAPE COLUMN DVNC XI (DISPOSABLE) ×2 IMPLANT
DRAPE DA VINCI XI ARM (DISPOSABLE) ×8
DRAPE DA VINCI XI COLUMN (DISPOSABLE) ×2
DRAPE INCISE IOBAN 66X45 STRL (DRAPES) ×3 IMPLANT
DRAPE SHEET LG 3/4 BI-LAMINATE (DRAPES) ×3 IMPLANT
DRSG TEGADERM 4X4.75 (GAUZE/BANDAGES/DRESSINGS) ×3 IMPLANT
ELECT PENCIL ROCKER SW 15FT (MISCELLANEOUS) ×3 IMPLANT
ELECT REM PT RETURN 15FT ADLT (MISCELLANEOUS) ×3 IMPLANT
EVACUATOR SILICONE 100CC (DRAIN) ×3 IMPLANT
GAUZE 4X4 16PLY ~~LOC~~+RFID DBL (SPONGE) ×3 IMPLANT
GAUZE SPONGE 2X2 8PLY STRL LF (GAUZE/BANDAGES/DRESSINGS) ×2 IMPLANT
GLOVE BIO SURGEON STRL SZ 6.5 (GLOVE) ×3 IMPLANT
GLOVE SURG LX 7.5 STRW (GLOVE) ×2
GLOVE SURG LX STRL 7.5 STRW (GLOVE) ×4 IMPLANT
GOWN SRG XL LVL 4 BRTHBL STRL (GOWNS) ×2 IMPLANT
GOWN STRL NON-REIN XL LVL4 (GOWNS) ×4
HEMOSTAT SURGICEL 4X8 (HEMOSTASIS) ×3 IMPLANT
HOLDER FOLEY CATH W/STRAP (MISCELLANEOUS) ×3 IMPLANT
IRRIG SUCT STRYKERFLOW 2 WTIP (MISCELLANEOUS) ×2
IRRIGATION SUCT STRKRFLW 2 WTP (MISCELLANEOUS) ×2 IMPLANT
KIT BASIN OR (CUSTOM PROCEDURE TRAY) ×3 IMPLANT
KIT TURNOVER KIT A (KITS) IMPLANT
LOOP VESSEL MAXI BLUE (MISCELLANEOUS) ×3 IMPLANT
MARKER SKIN DUAL TIP RULER LAB (MISCELLANEOUS) ×3 IMPLANT
NDL INSUFFLATION 14GA 120MM (NEEDLE) ×2 IMPLANT
NEEDLE HYPO 22GX1.5 SAFETY (NEEDLE) ×3 IMPLANT
NEEDLE INSUFFLATION 14GA 120MM (NEEDLE) ×2 IMPLANT
PORT ACCESS TROCAR AIRSEAL 12 (TROCAR) ×2 IMPLANT
PORT ACCESS TROCAR AIRSEAL 5M (TROCAR) ×1
PROTECTOR NERVE ULNAR (MISCELLANEOUS) ×6 IMPLANT
RELOAD STAPLE 45 2.6 WHT THIN (STAPLE) IMPLANT
SEAL CANN UNIV 5-8 DVNC XI (MISCELLANEOUS) ×8 IMPLANT
SEAL XI 5MM-8MM UNIVERSAL (MISCELLANEOUS) ×8
SET TRI-LUMEN FLTR TB AIRSEAL (TUBING) ×3 IMPLANT
SLEEVE SURGEON STRL (DRAPES) ×1 IMPLANT
SOLUTION ELECTROLUBE (MISCELLANEOUS) ×3 IMPLANT
SPIKE FLUID TRANSFER (MISCELLANEOUS) ×3 IMPLANT
SPONGE GAUZE 2X2 STER 10/PKG (GAUZE/BANDAGES/DRESSINGS) ×1
SPONGE T-LAP 4X18 ~~LOC~~+RFID (SPONGE) ×3 IMPLANT
STAPLE RELOAD 45 WHT (STAPLE) IMPLANT
STAPLE RELOAD 45MM WHITE (STAPLE)
SURGIFLO W/THROMBIN 8M KIT (HEMOSTASIS) ×4 IMPLANT
SUT ETHILON 3 0 PS 1 (SUTURE) ×3 IMPLANT
SUT MNCRL AB 4-0 PS2 18 (SUTURE) ×6 IMPLANT
SUT PDS AB 1 CT1 27 (SUTURE) ×7 IMPLANT
SUT V-LOC BARB 180 2/0GR6 GS22 (SUTURE)
SUT VIC AB 0 CT1 27 (SUTURE) ×8
SUT VIC AB 0 CT1 27XBRD ANTBC (SUTURE) ×8 IMPLANT
SUT VIC AB 2-0 SH 27 (SUTURE) ×4
SUT VIC AB 2-0 SH 27X BRD (SUTURE) ×4 IMPLANT
SUT VICRYL 0 UR6 27IN ABS (SUTURE) ×1 IMPLANT
SUT VLOC BARB 180 ABS3/0GR12 (SUTURE) ×2
SUTURE V-LC BRB 180 2/0GR6GS22 (SUTURE) IMPLANT
SUTURE VLOC BRB 180 ABS3/0GR12 (SUTURE) ×2 IMPLANT
SYS BAG RETRIEVAL 10MM (BASKET) ×2
SYSTEM BAG RETRIEVAL 10MM (BASKET) ×2 IMPLANT
TOWEL OR 17X26 10 PK STRL BLUE (TOWEL DISPOSABLE) ×3 IMPLANT
TOWEL OR NON WOVEN STRL DISP B (DISPOSABLE) ×3 IMPLANT
TRAY FOLEY MTR SLVR 16FR STAT (SET/KITS/TRAYS/PACK) ×3 IMPLANT
TRAY LAPAROSCOPIC (CUSTOM PROCEDURE TRAY) ×3 IMPLANT
TROCAR ADV FIXATION 12X100MM (TROCAR) ×3 IMPLANT
TROCAR Z-THREAD OPTICAL 5X100M (TROCAR) IMPLANT
WATER STERILE IRR 1000ML POUR (IV SOLUTION) ×6 IMPLANT

## 2022-01-01 NOTE — Progress Notes (Signed)
  Transition of Care [facility]) Screening Note   Patient Details  Name: Anna Bauer Date of Birth: March 23, 1971   [facility]) CM/SW Contact:    Dessa Phi, RN Phone Number: 01/01/2022, 3:30 PM    Transition of Care Department [facility]) has reviewed patient and no TOC needs have been identified at this time. We will continue to monitor patient advancement through interdisciplinary progression rounds. If new patient transition needs arise, please place a TOC consult.

## 2022-01-01 NOTE — Progress Notes (Signed)
PA Dancy notified of left chest, subcontinuous air. No new orders. Paged on behalf of primary RN, Jeanette Caprice.

## 2022-01-01 NOTE — Anesthesia Procedure Notes (Signed)
Procedure Name: Intubation Date/Time: 01/01/2022 8:56 AM  Performed by: Gean Maidens, CRNAPre-anesthesia Checklist: Patient identified, Emergency Drugs available, Suction available, Patient being monitored and Timeout performed Patient Re-evaluated:Patient Re-evaluated prior to induction Oxygen Delivery Method: Circle system utilized Preoxygenation: Pre-oxygenation with 100% oxygen Induction Type: IV induction Ventilation: Mask ventilation without difficulty Laryngoscope Size: Mac and 4 Grade View: Grade II Tube type: Oral Tube size: 7.0 mm Number of attempts: 1 Airway Equipment and Method: Stylet Placement Confirmation: ETT inserted through vocal cords under direct vision, positive ETCO2 and breath sounds checked- equal and bilateral Secured at: 21 cm Tube secured with: Tape Dental Injury: Teeth and Oropharynx as per pre-operative assessment

## 2022-01-01 NOTE — H&P (Signed)
Anna Bauer is an 51 y.o. female.    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 Mass is solitary, about 50% exophytic. 1 artery / 1 vein left renovascular anatomy. No contralateral lesions. Cr0.8.   PMH sig for ENT surgery. 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 Her PCP is [facility] with Jefm Bryant.   Today " Anna Bauer " is seen to proceed with LEFT robotic partial nephrectomy. Hgb 13.9. No interval fevers.    Past Medical History:  Diagnosis Date   BCC (basal cell carcinoma) 1992   R cheek tx with MOHS   Seasonal allergies     Past Surgical History:  Procedure Laterality Date   ADENOIDECTOMY  1978   INNER EAR SURGERY     MOHS SURGERY     around 2000   WISDOM TOOTH EXTRACTION      Family History  Problem Relation Age of Onset   Endometrial cancer Mother    Colon polyps Father 37   Hypertension Father    Heart attack Father 78   Lung cancer Father        tobacco   Kidney disease Father    Hypertension Brother    Lung cancer Paternal Aunt    Diabetes Maternal Grandfather    Arthritis Paternal Grandfather    Colitis Neg Hx    Colon cancer Neg Hx    Esophageal cancer Neg Hx    Rectal cancer Neg Hx    Stomach cancer Neg Hx    Social History:  reports that she has never smoked. She has never used smokeless tobacco. She reports current alcohol use of about 4.0 standard drinks of alcohol per week. She reports that she does not use drugs.  Allergies: No Known Allergies  Medications Prior to Admission  Medication Sig Dispense Refill   ascorbic acid (VITAMIN C) 1000 MG tablet Take 500-1,000 mg by mouth daily at 6 (six) AM.     JUNEL 1/20 1-20 MG-MCG tablet Take 1 tablet by mouth daily.     loratadine (CLARITIN) 10 MG tablet Take 10 mg by mouth daily.      Results for orders placed or performed during the hospital encounter of 01/01/22 (from the  past 48 hour(s))  Pregnancy, urine per protocol     Status: None   Collection Time: 01/01/22  6:28 AM  Result Value Ref Range   Preg Test, Ur NEGATIVE NEGATIVE    Comment:        THE SENSITIVITY OF THIS METHODOLOGY IS >20 mIU/mL. Performed at [facility], [place] [address], [place], [place] [address]    No results found.  Review of Systems  Constitutional:  Negative for chills and fatigue.  All other systems reviewed and are negative.   Blood pressure (!) 149/87, pulse 86, temperature 98.8 F (37.1 C), temperature source Oral, resp. rate 16, SpO2 100 %. Physical Exam Vitals reviewed.  HENT:     Mouth/Throat:     Mouth: Mucous membranes are moist.  Eyes:     Pupils: Pupils are equal, round, and reactive to light.  Cardiovascular:     Rate and Rhythm: Normal rate.  Pulmonary:     Effort: Pulmonary effort is normal.  Abdominal:     General: Abdomen is flat.  Genitourinary:    Comments: No CVAT at present Musculoskeletal:        General:  Normal range of motion.     Cervical back: Normal range of motion.  Skin:    General: Skin is warm.  Neurological:     General: No focal deficit present.     Mental Status: She is alert.  Psychiatric:        Mood and Affect: Mood normal.      Assessment/Plan  Proceed as planned with LEFT robotic partial nephrectomy. Risks, benefits, alternatives, expected peri-op course discussed previously and reiterated today.   Alexis Frock, MD 01/01/2022, 7:07 AM

## 2022-01-01 NOTE — Transfer of Care (Signed)
Immediate Anesthesia Transfer of Care Note  Patient: Anna Bauer  Procedure(s) Performed: XI ROBOTIC ASSITED PARTIAL NEPHRECTOMY (Left)  Patient Location: PACU  Anesthesia Type:General  Level of Consciousness: awake, alert  and oriented  Airway & Oxygen Therapy: Patient Spontanous Breathing and Patient connected to face mask oxygen  Post-op Assessment: Report given to RN and Post -op Vital signs reviewed and stable  Post vital signs: Reviewed and stable  Last Vitals:  Vitals Value Taken Time  BP 123/68 01/01/22 1112  Temp    Pulse 88 01/01/22 1114  Resp 13 01/01/22 1114  SpO2 98 % 01/01/22 1114  Vitals shown include unvalidated device data.  Last Pain:  Vitals:   01/01/22 0735  TempSrc:   PainSc: 0-No pain         Complications: No notable events documented.

## 2022-01-01 NOTE — Discharge Instructions (Addendum)

## 2022-01-01 NOTE — Op Note (Signed)
NAME: Anna Bauer, Anna Bauer MEDICAL RECORD NO: [id number] ACCOUNT NO: [id number] DATE OF BIRTH: 03-20-1971 FACILITY: [facility] LOCATION: [facility] PHYSICIAN: Sebastian Ache, MD  Operative Report   DATE OF PROCEDURE: 01/01/2022  PREOPERATIVE DIAGNOSIS:  Left renal mass.  PROCEDURE: 1.  Left robotic partial nephrectomy. 2.  Intraoperative ultrasound interpretation.  ESTIMATED BLOOD LOSS:  50 mL.  COMPLICATIONS:  None.  SPECIMEN:  Left partial nephrectomy for permanent pathology.  FINDINGS:   1.  Solid approximately 50% endophytic left extreme lower pole renal mass. 2.  Single artery, single vein, left renal vascular anatomy with large lumbar vein noted.  Warm ischemia time 16 minutes.  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  Options were discussed including surveillance protocols versus surgical extirpation versus ablative therapies and she wished to proceed with partial nephrectomy.  Informed consent was obtained and placed in medical record.  PROCEDURE IN DETAIL:  The patient being identified, procedure being left robotic partial nephrectomy was confirmed.  Procedure timeout was performed.  Intravenous antibiotics were administered.  General endotracheal anesthesia induced.  The patient was  placed into a left side up full flank position and pulling 15 degrees of table flexion, superior arm elevator, axillary roll, sequential compression devices, bottom leg bent, top leg straight.  She was further fastened to the operating table using 3-inch  tape over foam padding across the supraxiphoid chest and her pelvis.  Beanbag was deployed.  Sterile field was created, prepped and draped the patient's entire left flank and abdomen using chlorhexidine gluconate.  A  high flow, low pressure  pneumoperitoneum was obtained using Veress technique in the left lower quadrant, having passed the aspiration and drop test.  An 8 mm robotic camera port was then placed in the position approximately 4 fingerbreadths superolateral to the umbilicus.   Laparoscopic examination of peritoneal cavity revealed no significant adhesions, no visceral injury.  Additional ports were placed as follows:  Left paramedian inferior robotic port, approximately 1 handbreadth superior to the pubic ramus, left far  lateral 8 mm robotic port, approximately 4 fingerbreadths superomedial to the anterior iliac spine, left superior robotic port, approximately 1 fingerbreadth inferior to the costal margin and two 12 mm assistant port sites in the midline, one in the  supraumbilical crease and another approximately 2 fingerbreadths superior to the planned camera port, superior one being AirSeal type.  Robot was docked and passed the electronic checks.  Initial attention was directed at development of the  retroperitoneum.  Incision was made lateral to the ascending colon from the area of the splenic flexure towards the area of the internal ring and the colon was carefully swept medially.  Lateral splenic attachments were taken down very aggressively to  allow the spleen and pancreas to rotate medially away from the anterior superior aspect of the kidney.  The plane between the pancreas and Gerota's fascia was further developed very carefully and allowing these structures to rotate medially away from the  kidney.  I was quite happy with the soft retraction of this lower pole kidney area was identified, placed on gentle lateral traction.  Dissection proceeded medial to this.  Ureter and gonadal vessels were encountered.  Gonadal vessel was quite large.   These were placed on lateral traction.  Dissection proceeded  within this triangle towards the renal hilum.  Renal hilum consisted of single artery, single vein  renal vascular anatomy as anticipated.  The vein did have a very large lumbar vein that did  preclude effectively new to the artery from the inferior approach; however, the artery was easily noted from the superior approach and circumferentially mobilized and superior to the renal vein circumferentially mobilized and a vessel loop was applied.   Attention was directed at identification of the mass and further mobilization of the upper pole of the kidney. The plane between the adrenal and superior medial aspect of the kidney was carefully developed to allow the upper pole to rotate laterally and  anteriorly and then superiorly and superolaterally, which was then divided better mobilization of the upper pole of the kidney.  The mass was identified on the anterior superior location as anticipated.  It did grossly appear neoplastic.  Intraoperative  ultrasound was performed using a drop-in probe.  Intraoperative ultrasound revealed a solitary upper pole mass.  It was a solid heterogeneous and did appear approximately 50% endophytic. The deep aspect did not appear to be intrarenal collecting system or large  vessels.  Next, warm ischemia was  achieved using bulldog clamps x2 on the artery and very careful partial nephrectomy was performed using cold scissors, keeping what appeared to be a rim of normal parenchyma with the proximal nephrectomy specimen. This is set aside for later retrieval.   First layer renorrhaphy was performed using running 3-0 V-Loc suture, oversewing several small venous sinuses.    A bolster was applied and three parenchymal apposition sutures of 0 Vicryl sandwiched between Hem-o-loks and  Lapra-Tys applied, which showed an excellent parenchymal apposition and compression on the renorrhaphy. Bulldog clamps were released.  Total warm ischemia time of 16 minutes.  Hemostasis was quite good.  All renorrhaphy sutures were removed.  Sponge and  needle counts were correct.  The specimen was  placed in EndoCatch bag for later retrieval.  The vessel loop was removed.  A closed suction drain was brought out the was brought out the previous lateral most robotic port site into the peritoneal cavity.   Robot was then undocked.  The superior most assistant port site was closed with fascia using collecting system suture passer and 0 Vicryl.  The inferior assistant port site was extended superiorly for a total distance of approximately 2 cm.  The partial  nephrectomy specimen removed, set aside for permanent pathology.  This extraction site was then closed with fascia using figure-of-eight PDS x3 followed by reapproximation of Scarpa's with running Vicryl.  All incision sites were infiltrated with dilute  lipolyzed Marcaine and closed at the level of the skin using subcuticular Monocryl followed by Dermabond.  Procedure was terminated.  The patient tolerated the procedure well, no immediate perioperative complications.  The patient was taken to  postanesthesia care in stable condition.  Please note, first assistant, Harrie Foreman, was crucial for all portions of the surgery today.  She provided invaluable retraction, suctioning, vascular clamping, specimen manipulation, suture passage and general first assistance.   PUS D: 01/01/2022 11:09:50 am T: 01/01/2022 12:11:00 pm  JOB: [id number]/ [id number]

## 2022-01-01 NOTE — Brief Op Note (Signed)
01/01/2022  11:01 AM  PATIENT:  Anna Bauer  51 y.o. female  PRE-OPERATIVE DIAGNOSIS:  LEFT RENAL MASS  POST-OPERATIVE DIAGNOSIS:  LEFT RENAL MASS  PROCEDURE:  Procedure(s) with comments: XI ROBOTIC ASSITED PARTIAL NEPHRECTOMY (Left) - 3 HRS  SURGEON:  Surgeon(s) and Role:    Alexis Frock, MD - Primary  PHYSICIAN ASSISTANT:   ASSISTANTS: Clemetine Marker PA   ANESTHESIA:   local and general  EBL:  50 mL   BLOOD ADMINISTERED:none  DRAINS:  1 - JP to bulb; 2 - Foley to gravity    LOCAL MEDICATIONS USED:  MARCAINE     SPECIMEN:  Source of Specimen:  left partial nephrectomy  DISPOSITION OF SPECIMEN:  PATHOLOGY  COUNTS:  YES  TOURNIQUET:  * No tourniquets in log *  DICTATION: .Other Dictation: Dictation Number [id number]  PLAN OF CARE: Admit for overnight observation  PATIENT DISPOSITION:  PACU - hemodynamically stable.   Delay start of Pharmacological VTE agent (>24hrs) due to surgical blood loss or risk of bleeding: yes

## 2022-01-02 ENCOUNTER — Encounter (HOSPITAL_COMMUNITY): Payer: Self-pay | Admitting: Urology

## 2022-01-02 DIAGNOSIS — Z85828 Personal history of other malignant neoplasm of skin: Secondary | ICD-10-CM | POA: Diagnosis not present

## 2022-01-02 DIAGNOSIS — C642 Malignant neoplasm of left kidney, except renal pelvis: Secondary | ICD-10-CM | POA: Diagnosis not present

## 2022-01-02 DIAGNOSIS — Z79899 Other long term (current) drug therapy: Secondary | ICD-10-CM | POA: Diagnosis not present

## 2022-01-02 DIAGNOSIS — N2889 Other specified disorders of kidney and ureter: Secondary | ICD-10-CM | POA: Diagnosis not present

## 2022-01-02 LAB — BASIC METABOLIC PANEL
Anion gap: 6 (ref 5–15)
BUN: 8 mg/dL (ref 6–20)
CO2: 24 mmol/L (ref 22–32)
Calcium: 8.9 mg/dL (ref 8.9–10.3)
Chloride: 107 mmol/L (ref 98–111)
Creatinine, Ser: 0.93 mg/dL (ref 0.44–1.00)
GFR, Estimated: >60 mL/min (ref >60)
Glucose, Bld: 96 mg/dL (ref 70–99)
Potassium: 3.8 mmol/L (ref 3.5–5.1)
Sodium: 137 mmol/L (ref 135–145)

## 2022-01-02 LAB — HEMOGLOBIN AND HEMATOCRIT, BLOOD
HCT: 37.9 % (ref 36.0–46.0)
Hemoglobin: 12.9 g/dL (ref 12.0–15.0)

## 2022-01-02 LAB — SURGICAL PATHOLOGY

## 2022-01-02 MED ORDER — PHENOL 1.4 % MT LIQD
1.0000 | OROMUCOSAL | Status: DC | PRN
  Filled 2022-01-02: qty 177

## 2022-01-02 NOTE — Discharge Summary (Signed)
Physician Discharge Summary  Patient ID: Anna Bauer MRN: [id number] DOB/AGE: [birth year] 51 y.o.  Admit date: 01/01/2022 Discharge date: 01/02/2022  Admission Diagnoses:  Discharge Diagnoses:  Principal Problem:   Renal mass   Discharged Condition: good  Hospital Course: 51F who underwent left robotic partial nephrectomy for a left renal mass on 01/02/22. She tolerated the procedure well and post operatively was sent to the floor. Her pain was well controlled and on day of discharge she was able to ambulate, tolerate a regular diet, and void spontaneously. Her drain was removed.  Consults: None  Significant Diagnostic Studies: none  Treatments: analgesia: acetaminophen and oxycodone and surgery: left partial nephrectomy  Discharge Exam: Blood pressure 126/72, pulse 79, temperature 98.9 F (37.2 C), temperature source Oral, resp. rate 16, height '5\' 5"'$  (1.651 m), weight 63.7 kg, last menstrual period 03/21/2017, SpO2 96 %. General appearance: alert, cooperative, and no distress Head: Normocephalic, without obvious abnormality, atraumatic Chest wall: no tenderness GI: soft, non-tender; bowel sounds normal; no masses,  no organomegaly Incision/Wound: clean/dry/intact  Disposition:  home  Allergies as of 01/02/2022   No Known Allergies      Medication List     STOP taking these medications    ascorbic acid 1000 MG tablet Commonly known as: VITAMIN C       TAKE these medications    docusate sodium 100 MG capsule Commonly known as: COLACE Take 1 capsule (100 mg total) by mouth 2 (two) times daily.   HYDROcodone-acetaminophen 5-325 MG tablet Commonly known as: Norco Take 1-2 tablets by mouth every 6 (six) hours as needed for moderate pain or severe pain.   Junel 1/20 1-20 MG-MCG tablet Generic drug: norethindrone-ethinyl estradiol Take 1 tablet by mouth daily.   loratadine 10 MG tablet Commonly known as: CLARITIN Take 10 mg by mouth daily.         Follow-up Information     Alexis Frock, MD Follow up on 01/13/2022.   Specialty: Urology Why: at 9:15 AM for MD visit Contact information: [address] [place] [address] [phone]                 Signed: Florentina Addison 01/02/2022, 10:21 AM

## 2022-01-02 NOTE — Anesthesia Postprocedure Evaluation (Signed)
Anesthesia Post Note  Patient: Anna Bauer  Procedure(s) Performed: XI ROBOTIC ASSITED PARTIAL NEPHRECTOMY (Left)     Patient location during evaluation: PACU Anesthesia Type: General Level of consciousness: awake Pain management: pain level controlled Vital Signs Assessment: post-procedure vital signs reviewed and stable Respiratory status: spontaneous breathing, nonlabored ventilation, respiratory function stable and patient connected to nasal cannula oxygen Cardiovascular status: blood pressure returned to baseline and stable Postop Assessment: no apparent nausea or vomiting Anesthetic complications: no   No notable events documented.  Last Vitals:  Vitals:   01/02/22 0531 01/02/22 1220  BP: 126/72 121/75  Pulse: 79 95  Resp: 16 18  Temp: 37.2 C   SpO2: 96% 98%    Last Pain:  Vitals:   01/02/22 1415  TempSrc:   PainSc: 5                  Anna Bauer

## 2022-01-13 DIAGNOSIS — C642 Malignant neoplasm of left kidney, except renal pelvis: Secondary | ICD-10-CM | POA: Diagnosis not present

## 2022-01-13 DIAGNOSIS — R3915 Urgency of urination: Secondary | ICD-10-CM | POA: Diagnosis not present

## 2022-02-05 ENCOUNTER — Telehealth: Payer: Self-pay | Admitting: Interventional Radiology

## 2022-03-21 ENCOUNTER — Telehealth (HOSPITAL_BASED_OUTPATIENT_CLINIC_OR_DEPARTMENT_OTHER): Payer: Self-pay | Admitting: Neurology

## 2022-03-21 NOTE — Telephone Encounter (Signed)
RETURN CALL: Voicemail - Detailed Message      SUBJECT:  Referral Request/Questions - Incoming     REASON FOR REFERRAL: New patient: Transfer of care to Dr. Joaquin Courts for the following reasons: autism (the patient has been diagnosed), depression, severe ADHD, migraine and mood instability with complicated past medication history. The patient needs medication evaluation and management.     The CCR had to create a chart for Jocelyn Hoover since there is no electronic referral.    Please note: The referral from Dr. Beckie Busing was faxed to your clinic attention Dr. Joaquin Courts to fax #[phone] on Wednesday, 02/26/2022.    DATE SENT: Wednesday, 02/26/2022.    REFERRING CLINIC/PROVIDER: Dr. Beckie Busing (private practice).     QUESTIONS: Has your clinic received Jocelyn Hoover's referral and what is the status?    Please contact Jocelyn Hoover at [phone]. Thanks!

## 2022-03-27 NOTE — Telephone Encounter (Signed)
Spoke with pt to confirm receipt of referral.  Informed of a year wait list.  Offered resources, and that we need to send our Adult Autism Intake form, for her to complete.  Confirmed email address in her demographics, and sent the Intake and resources to [email or web address].

## 2022-03-31 DIAGNOSIS — B9620 Unspecified Escherichia coli [E. coli] as the cause of diseases classified elsewhere: Secondary | ICD-10-CM | POA: Diagnosis not present

## 2022-03-31 DIAGNOSIS — N390 Urinary tract infection, site not specified: Secondary | ICD-10-CM | POA: Diagnosis not present

## 2022-03-31 DIAGNOSIS — N3000 Acute cystitis without hematuria: Secondary | ICD-10-CM | POA: Diagnosis not present

## 2022-06-25 ENCOUNTER — Encounter: Payer: BC Managed Care – PPO | Admitting: Dermatology

## 2022-07-28 ENCOUNTER — Other Ambulatory Visit (HOSPITAL_COMMUNITY): Payer: Self-pay | Admitting: Urology

## 2022-07-28 ENCOUNTER — Ambulatory Visit (HOSPITAL_COMMUNITY)
Admission: RE | Admit: 2022-07-28 | Discharge: 2022-07-28 | Disposition: A | Discharge status: Home / Self Care | Payer: BC Managed Care – PPO | Source: Ambulatory Visit | Attending: Urology | Admitting: Urology

## 2022-07-28 DIAGNOSIS — Z85528 Personal history of other malignant neoplasm of kidney: Secondary | ICD-10-CM | POA: Diagnosis not present

## 2022-07-28 DIAGNOSIS — C642 Malignant neoplasm of left kidney, except renal pelvis: Secondary | ICD-10-CM

## 2022-08-04 DIAGNOSIS — Z905 Acquired absence of kidney: Secondary | ICD-10-CM | POA: Diagnosis not present

## 2022-08-04 DIAGNOSIS — C642 Malignant neoplasm of left kidney, except renal pelvis: Secondary | ICD-10-CM | POA: Diagnosis not present

## 2022-08-11 DIAGNOSIS — R3915 Urgency of urination: Secondary | ICD-10-CM | POA: Diagnosis not present

## 2022-08-11 DIAGNOSIS — C642 Malignant neoplasm of left kidney, except renal pelvis: Secondary | ICD-10-CM | POA: Diagnosis not present

## 2022-09-03 ENCOUNTER — Encounter: Payer: Self-pay | Admitting: Dermatology

## 2022-09-03 ENCOUNTER — Ambulatory Visit (INDEPENDENT_AMBULATORY_CARE_PROVIDER_SITE_OTHER): Payer: BC Managed Care – PPO | Admitting: Dermatology

## 2022-09-03 VITALS — BP 149/91 | HR 71

## 2022-09-03 DIAGNOSIS — D2339 Other benign neoplasm of skin of other parts of face: Secondary | ICD-10-CM | POA: Diagnosis not present

## 2022-09-03 DIAGNOSIS — D2330 Other benign neoplasm of skin of unspecified part of face: Secondary | ICD-10-CM

## 2022-09-03 DIAGNOSIS — D225 Melanocytic nevi of trunk: Secondary | ICD-10-CM

## 2022-09-03 DIAGNOSIS — D2371 Other benign neoplasm of skin of right lower limb, including hip: Secondary | ICD-10-CM

## 2022-09-03 DIAGNOSIS — Z1283 Encounter for screening for malignant neoplasm of skin: Secondary | ICD-10-CM | POA: Diagnosis not present

## 2022-09-03 DIAGNOSIS — Z85828 Personal history of other malignant neoplasm of skin: Secondary | ICD-10-CM | POA: Diagnosis not present

## 2022-09-03 DIAGNOSIS — L814 Other melanin hyperpigmentation: Secondary | ICD-10-CM

## 2022-09-03 DIAGNOSIS — L821 Other seborrheic keratosis: Secondary | ICD-10-CM

## 2022-09-03 DIAGNOSIS — L813 Cafe au lait spots: Secondary | ICD-10-CM

## 2022-09-03 DIAGNOSIS — L738 Other specified follicular disorders: Secondary | ICD-10-CM

## 2022-09-03 DIAGNOSIS — D229 Melanocytic nevi, unspecified: Secondary | ICD-10-CM

## 2022-09-03 DIAGNOSIS — L578 Other skin changes due to chronic exposure to nonionizing radiation: Secondary | ICD-10-CM

## 2022-09-03 NOTE — Patient Instructions (Signed)
Recommend taking Heliocare sun protection supplement daily in sunny weather for additional sun protection. For maximum protection on the sunniest days, you can take up to 2 capsules of regular Heliocare OR take 1 capsule of Heliocare Ultra. For prolonged exposure (such as a full day in the sun), you can repeat your dose of the supplement 4 hours after your first dose. Heliocare can be purchased at  [facility], at some [facility] or at www.[email or web address].    Melanoma ABCDEs  Melanoma is the most dangerous type of skin cancer, and is the leading cause of death from skin disease.  You are more likely to develop melanoma if you: Have light-colored skin, light-colored eyes, or red or blond hair Spend a lot of time in the sun Tan regularly, either outdoors or in a tanning bed Have had blistering sunburns, especially during childhood Have a close family member who has had a melanoma Have atypical moles or large birthmarks  Early detection of melanoma is key since treatment is typically straightforward and cure rates are extremely high if we catch it early.   The first sign of melanoma is often a change in a mole or a new dark spot.  The ABCDE system is a way of remembering the signs of melanoma.  A for asymmetry:  The two halves do not match. B for border:  The edges of the growth are irregular. C for color:  A mixture of colors are present instead of an even brown color. D for diameter:  Melanomas are usually (but not always) greater than 6mm - the size of a pencil eraser. E for evolution:  The spot keeps changing in size, shape, and color.  Please check your skin once per month between visits. You can use a small mirror in front and a large mirror behind you to keep an eye on the back side or your body.   If you see any new or changing lesions before your next follow-up, please call to schedule a visit.  Please continue daily skin protection including broad spectrum sunscreen SPF 30+ to  sun-exposed areas, reapplying every 2 hours as needed when you're outdoors.    Due to recent changes in healthcare laws, you may see results of your pathology and/or laboratory studies on MyChart before the doctors have had a chance to review them. We understand that in some cases there may be results that are confusing or concerning to you. Please understand that not all results are received at the same time and often the doctors may need to interpret multiple results in order to provide you with the best plan of care or course of treatment. Therefore, we ask that you please give us 2 business days to thoroughly review all your results before contacting the office for clarification. Should we see a critical lab result, you will be contacted sooner.   If You Need Anything After Your Visit  If you have any questions or concerns for your doctor, please call our main line at [phone] and press option 4 to reach your doctor's medical assistant. If no one answers, please leave a voicemail as directed and we will return your call as soon as possible. Messages left after 4 pm will be answered the following business day.   You may also send us a message via MyChart. We typically respond to MyChart messages within 1-2 business days.  For prescription refills, please ask your pharmacy to contact our office. Our fax number is [phone].  If you have   an urgent issue when the clinic is closed that cannot wait until the next business day, you can page your doctor at the number below.    Please note that while we do our best to be available for urgent issues outside of office hours, we are not available 24/7.   If you have an urgent issue and are unable to reach us, you may choose to seek medical care at your doctor's office, retail clinic, urgent care center, or emergency room.  If you have a medical emergency, please immediately call 911 or go to the emergency department.  Pager Numbers  - Dr.  Kowalski: [phone]  - Dr. Moye: [phone]  - Dr. Stewart: [phone]  In the event of inclement weather, please call our main line at [phone] for an update on the status of any delays or closures.  Dermatology Medication Tips: Please keep the boxes that topical medications come in in order to help keep track of the instructions about where and how to use these. Pharmacies typically print the medication instructions only on the boxes and not directly on the medication tubes.   If your medication is too expensive, please contact our office at [phone] option 4 or send us a message through MyChart.   We are unable to tell what your co-pay for medications will be in advance as this is different depending on your insurance coverage. However, we may be able to find a substitute medication at lower cost or fill out paperwork to get insurance to cover a needed medication.   If a prior authorization is required to get your medication covered by your insurance company, please allow us 1-2 business days to complete this process.  Drug prices often vary depending on where the prescription is filled and some pharmacies may offer cheaper prices.  The website www.goodrx.com contains coupons for medications through different pharmacies. The prices here do not account for what the cost may be with help from insurance (it may be cheaper with your insurance), but the website can give you the price if you did not use any insurance.  - You can print the associated coupon and take it with your prescription to the pharmacy.  - You may also stop by our office during regular business hours and pick up a GoodRx coupon card.  - If you need your prescription sent electronically to a different pharmacy, notify our office through [facility] MyChart or by phone at [phone] option 4.     Si Usted Necesita Algo Despus de Su Visita  Tambin puede enviarnos un mensaje a travs de MyChart. Por lo  general respondemos a los mensajes de MyChart en el transcurso de 1 a 2 das hbiles.  Para renovar recetas, por favor pida a su farmacia que se ponga en contacto con nuestra oficina. Nuestro nmero de fax es el [phone].  Si tiene un asunto urgente cuando la clnica est cerrada y que no puede esperar hasta el siguiente da hbil, puede llamar/localizar a su doctor(a) al nmero que aparece a continuacin.   Por favor, tenga en cuenta que aunque hacemos todo lo posible para estar disponibles para asuntos urgentes fuera del horario de oficina, no estamos disponibles las 24 horas del da, los 7 das de la semana.   Si tiene un problema urgente y no puede comunicarse con nosotros, puede optar por buscar atencin mdica  en el consultorio de su doctor(a), en una clnica privada, en un centro de atencin urgente o en una sala de   emergencias.  Si tiene una emergencia mdica, por favor llame inmediatamente al 911 o vaya a la sala de emergencias.  Nmeros de bper  - Dr. Kowalski: [phone]  - Dra. Moye: [phone]  - Dra. Stewart: [phone]  En caso de inclemencias del tiempo, por favor llame a nuestra lnea principal al [phone] para una actualizacin sobre el estado de cualquier retraso o cierre.  Consejos para la medicacin en dermatologa: Por favor, guarde las cajas en las que vienen los medicamentos de uso tpico para ayudarle a seguir las instrucciones sobre dnde y cmo usarlos. Las farmacias generalmente imprimen las instrucciones del medicamento slo en las cajas y no directamente en los tubos del medicamento.   Si su medicamento es muy caro, por favor, pngase en contacto con nuestra oficina llamando al [phone] y presione la opcin 4 o envenos un mensaje a travs de MyChart.   No podemos decirle cul ser su copago por los medicamentos por adelantado ya que esto es diferente dependiendo de la cobertura de su seguro. Sin embargo, es posible que podamos encontrar un  medicamento sustituto a menor costo o llenar un formulario para que el seguro cubra el medicamento que se considera necesario.   Si se requiere una autorizacin previa para que su compaa de seguros cubra su medicamento, por favor permtanos de 1 a 2 das hbiles para completar este proceso.  Los precios de los medicamentos varan con frecuencia dependiendo del lugar de dnde se surte la receta y alguna farmacias pueden ofrecer precios ms baratos.  El sitio web www.goodrx.com tiene cupones para medicamentos de diferentes farmacias. Los precios aqu no tienen en cuenta lo que podra costar con la ayuda del seguro (puede ser ms barato con su seguro), pero el sitio web puede darle el precio si no utiliz ningn seguro.  - Puede imprimir el cupn correspondiente y llevarlo con su receta a la farmacia.  - Tambin puede pasar por nuestra oficina durante el horario de atencin regular y recoger una tarjeta de cupones de GoodRx.  - Si necesita que su receta se enve electrnicamente a una farmacia diferente, informe a nuestra oficina a travs de MyChart de [place] o por telfono llamando al [phone] y presione la opcin 4.  

## 2022-09-03 NOTE — Progress Notes (Signed)
Follow-Up Visit   Subjective  Anna Bauer is a 52 y.o. female who presents for the following: FBSE (Hx BCC).  The patient presents for Total-Body Skin Exam (TBSE) for skin cancer screening and mole check.  The patient has spots, moles and lesions to be evaluated, some may be new or changing and the patient has concerns that these could be cancer.   The following portions of the chart were reviewed this encounter and updated as appropriate:   Tobacco  Allergies  Meds  Problems  Med Hx  Surg Hx  Fam Hx      Review of Systems:  No other skin or systemic complaints except as noted in HPI or Assessment and Plan.  Objective  Well appearing patient in no apparent distress; mood and affect are within normal limits.  A full examination was performed including scalp, head, eyes, ears, nose, lips, neck, chest, axillae, abdomen, back, buttocks, bilateral upper extremities, bilateral lower extremities, hands, feet, fingers, toes, fingernails, and toenails. All findings within normal limits unless otherwise noted below.  Left Lower Back 0.3 cm extremely thin dark brown papule symetric within tan patch 1.5 cm   Nose and mid face 1-2 mm flesh colored to slightly pink papules without features suspicious for malignancy on dermoscopy     Assessment & Plan  Nevus Left Lower Back  Benign-appearing. Stable compared to previous visit. Observation.  Call clinic for new or changing moles.  Recommend daily use of broad spectrum spf 30+ sunscreen to sun-exposed areas.    Fibrous papule of face Nose and mid face  Benign-appearing.  Observation.  Call clinic for new or changing lesions.     History of Basal Cell Carcinoma of the Skin - R cheek, treated with MOHS about 20 years ago. - No evidence of recurrence today - Recommend regular full body skin exams - Recommend daily broad spectrum sunscreen SPF 30+ to sun-exposed areas, reapply every 2 hours as needed.  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 - Call for any changes  Seborrheic Keratoses - Stuck-on, waxy, tan-brown papules and/or plaques  - Benign-appearing - Discussed benign etiology and prognosis. - Observe - Call for any changes  Melanocytic Nevi - Tan-brown and/or pink-flesh-colored symmetric macules and papules - Benign appearing on exam today - Observation - Call clinic for new or changing moles - Recommend daily use of broad spectrum spf 30+ sunscreen to sun-exposed areas.   Hemangiomas - Red papules - Discussed benign nature - Observe - Call for any changes  Actinic Damage - Chronic condition, secondary to cumulative UV/sun exposure - diffuse scaly erythematous macules with underlying dyspigmentation - Recommend daily broad spectrum sunscreen SPF 30+ to sun-exposed areas, reapply every 2 hours as needed.  - Staying in the shade or wearing long sleeves, sun glasses (UVA+UVB protection) and wide brim hats (4-inch brim around the entire circumference of the hat) are also recommended for sun protection.  - Call for new or changing lesions.  Sebaceous Hyperplasia - Small yellow papules with a  central dell - Benign - Observe  Skin cancer screening performed today.  Return in about 1 year (around 09/04/2023) for TBSE, Hx BCC.  Graciella Belton, RMA, am acting as scribe for Forest Gleason, MD .  Documentation: I have reviewed the above documentation for accuracy and completeness, and I agree with the above.  Forest Gleason, MD

## 2022-09-05 ENCOUNTER — Encounter: Payer: Self-pay | Admitting: Dermatology

## 2022-09-15 DIAGNOSIS — Z1231 Encounter for screening mammogram for malignant neoplasm of breast: Secondary | ICD-10-CM | POA: Diagnosis not present

## 2022-09-15 DIAGNOSIS — Z6824 Body mass index (BMI) 24.0-24.9, adult: Secondary | ICD-10-CM | POA: Diagnosis not present

## 2022-09-15 DIAGNOSIS — Z01419 Encounter for gynecological examination (general) (routine) without abnormal findings: Secondary | ICD-10-CM | POA: Diagnosis not present

## 2023-05-28 ENCOUNTER — Encounter (HOSPITAL_BASED_OUTPATIENT_CLINIC_OR_DEPARTMENT_OTHER): Payer: Self-pay

## 2023-06-29 ENCOUNTER — Telehealth (HOSPITAL_BASED_OUTPATIENT_CLINIC_OR_DEPARTMENT_OTHER): Payer: Self-pay

## 2023-06-29 NOTE — Telephone Encounter (Signed)
 RETURN CALL: Voicemail - Detailed Message      SUBJECT:  General Message     MESSAGE:     Jocelyn Hoover is sending in new referral, per pt

## 2023-08-06 ENCOUNTER — Other Ambulatory Visit (HOSPITAL_COMMUNITY): Payer: Self-pay | Admitting: Urology

## 2023-08-06 ENCOUNTER — Ambulatory Visit (HOSPITAL_COMMUNITY)
Admission: RE | Admit: 2023-08-06 | Discharge: 2023-08-06 | Disposition: A | Discharge status: Home / Self Care | Payer: BC Managed Care – PPO | Source: Ambulatory Visit | Attending: Urology | Admitting: Urology

## 2023-08-06 DIAGNOSIS — C642 Malignant neoplasm of left kidney, except renal pelvis: Secondary | ICD-10-CM | POA: Insufficient documentation

## 2023-08-06 DIAGNOSIS — Z85528 Personal history of other malignant neoplasm of kidney: Secondary | ICD-10-CM | POA: Diagnosis not present

## 2023-08-11 DIAGNOSIS — C642 Malignant neoplasm of left kidney, except renal pelvis: Secondary | ICD-10-CM | POA: Diagnosis not present

## 2023-08-11 DIAGNOSIS — Z905 Acquired absence of kidney: Secondary | ICD-10-CM | POA: Diagnosis not present

## 2023-08-13 DIAGNOSIS — C642 Malignant neoplasm of left kidney, except renal pelvis: Secondary | ICD-10-CM | POA: Diagnosis not present

## 2023-08-13 DIAGNOSIS — R3915 Urgency of urination: Secondary | ICD-10-CM | POA: Diagnosis not present

## 2023-09-08 ENCOUNTER — Encounter: Payer: BC Managed Care – PPO | Admitting: Dermatology

## 2023-09-09 ENCOUNTER — Encounter: Payer: BC Managed Care – PPO | Admitting: Dermatology

## 2023-09-22 ENCOUNTER — Ambulatory Visit: Payer: BC Managed Care – PPO | Admitting: Dermatology

## 2023-09-22 DIAGNOSIS — D225 Melanocytic nevi of trunk: Secondary | ICD-10-CM

## 2023-09-22 DIAGNOSIS — D239 Other benign neoplasm of skin, unspecified: Secondary | ICD-10-CM

## 2023-09-22 DIAGNOSIS — L578 Other skin changes due to chronic exposure to nonionizing radiation: Secondary | ICD-10-CM | POA: Diagnosis not present

## 2023-09-22 DIAGNOSIS — L905 Scar conditions and fibrosis of skin: Secondary | ICD-10-CM

## 2023-09-22 DIAGNOSIS — L814 Other melanin hyperpigmentation: Secondary | ICD-10-CM

## 2023-09-22 DIAGNOSIS — D1801 Hemangioma of skin and subcutaneous tissue: Secondary | ICD-10-CM

## 2023-09-22 DIAGNOSIS — L300 Nummular dermatitis: Secondary | ICD-10-CM

## 2023-09-22 DIAGNOSIS — D2371 Other benign neoplasm of skin of right lower limb, including hip: Secondary | ICD-10-CM

## 2023-09-22 DIAGNOSIS — L308 Other specified dermatitis: Secondary | ICD-10-CM

## 2023-09-22 DIAGNOSIS — D229 Melanocytic nevi, unspecified: Secondary | ICD-10-CM

## 2023-09-22 DIAGNOSIS — W908XXA Exposure to other nonionizing radiation, initial encounter: Secondary | ICD-10-CM | POA: Diagnosis not present

## 2023-09-22 DIAGNOSIS — L821 Other seborrheic keratosis: Secondary | ICD-10-CM

## 2023-09-22 DIAGNOSIS — D2372 Other benign neoplasm of skin of left lower limb, including hip: Secondary | ICD-10-CM

## 2023-09-22 DIAGNOSIS — Z85828 Personal history of other malignant neoplasm of skin: Secondary | ICD-10-CM

## 2023-09-22 DIAGNOSIS — Z1283 Encounter for screening for malignant neoplasm of skin: Secondary | ICD-10-CM | POA: Diagnosis not present
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
## 2023-09-22 NOTE — Patient Instructions (Addendum)
 Use TMC 1% ointment 1-2 times daily prn for itchy rash. Discontinue when resolved. Avoid applying to face, groin, and axilla. Use as directed. Long-term use can cause thinning of the skin.   Topical steroids (such as triamcinolone, fluocinolone, fluocinonide, mometasone, clobetasol, halobetasol, betamethasone, hydrocortisone) can cause thinning and lightening of the skin if they are used for too long in the same area. Your physician has selected the right strength medicine for your problem and area affected on the body. Please use your medication only as directed by your physician to prevent side effects.     Melanoma ABCDEs  Melanoma is the most dangerous type of skin cancer, and is the leading cause of death from skin disease.  You are more likely to develop melanoma if you: Have light-colored skin, light-colored eyes, or red or blond hair Spend a lot of time in the sun Tan regularly, either outdoors or in a tanning bed Have had blistering sunburns, especially during childhood Have a close family member who has had a melanoma Have atypical moles or large birthmarks  Early detection of melanoma is key since treatment is typically straightforward and cure rates are extremely high if we catch it early.   The first sign of melanoma is often a change in a mole or a new dark spot.  The ABCDE system is a way of remembering the signs of melanoma.  A for asymmetry:  The two halves do not match. B for border:  The edges of the growth are irregular. C for color:  A mixture of colors are present instead of an even brown color. D for diameter:  Melanomas are usually (but not always) greater than 6mm - the size of a pencil eraser. E for evolution:  The spot keeps changing in size, shape, and color.  Please check your skin once per month between visits. You can use a small mirror in front and a large mirror behind you to keep an eye on the back side or your body.   If you see any new or changing  lesions before your next follow-up, please call to schedule a visit.  Please continue daily skin protection including broad spectrum sunscreen SPF 30+ to sun-exposed areas, reapplying every 2 hours as needed when you're outdoors.   Staying in the shade or wearing long sleeves, sun glasses (UVA+UVB protection) and wide brim hats (4-inch brim around the entire circumference of the hat) are also recommended for sun protection.     Due to recent changes in healthcare laws, you may see results of your pathology and/or laboratory studies on MyChart before the doctors have had a chance to review them. We understand that in some cases there may be results that are confusing or concerning to you. Please understand that not all results are received at the same time and often the doctors may need to interpret multiple results in order to provide you with the best plan of care or course of treatment. Therefore, we ask that you please give [place] 2 business days to thoroughly review all your results before contacting the office for clarification. Should we see a critical lab result, you will be contacted sooner.   If You Need Anything After Your Visit  If you have any questions or concerns for your doctor, please call our main line at [phone] and press option 4 to reach your doctor's medical assistant. If no one answers, please leave a voicemail as directed and we will return your call as soon as possible.  Messages left after 4 pm will be answered the following business day.   You may also send [place] a message via MyChart. We typically respond to MyChart messages within 1-2 business days.  For prescription refills, please ask your pharmacy to contact our office. Our fax number is [phone].  If you have an urgent issue when the clinic is closed that cannot wait until the next business day, you can page your doctor at the number below.    Please note that while we do our best to be available for urgent issues  outside of office hours, we are not available 24/7.   If you have an urgent issue and are unable to reach [place], you may choose to seek medical care at your doctor's office, retail clinic, urgent care center, or emergency room.  If you have a medical emergency, please immediately call 911 or go to the emergency department.  Pager Numbers  - Dr. Gwen Pounds: [phone]  - Dr. Roseanne Reno: [phone]  - Dr. Katrinka Blazing: [phone]   In the event of inclement weather, please call our main line at [phone] for an update on the status of any delays or closures.  Dermatology Medication Tips: Please keep the boxes that topical medications come in in order to help keep track of the instructions about where and how to use these. Pharmacies typically print the medication instructions only on the boxes and not directly on the medication tubes.   If your medication is too expensive, please contact our office at [phone] option 4 or send [place] a message through MyChart.   We are unable to tell what your co-pay for medications will be in advance as this is different depending on your insurance coverage. However, we may be able to find a substitute medication at lower cost or fill out paperwork to get insurance to cover a needed medication.   If a prior authorization is required to get your medication covered by your insurance company, please allow [place] 1-2 business days to complete this process.  Drug prices often vary depending on where the prescription is filled and some pharmacies may offer cheaper prices.  The website www.goodrx.com contains coupons for medications through different pharmacies. The prices here do not account for what the cost may be with help from insurance (it may be cheaper with your insurance), but the website can give you the price if you did not use any insurance.  - You can print the associated coupon and take it with your prescription to the pharmacy.  - You may also stop by our  office during regular business hours and pick up a GoodRx coupon card.  - If you need your prescription sent electronically to a different pharmacy, notify our office through [facility] or by phone at [phone] option 4.     Si Usted Necesita Algo Despus de Su Visita  Tambin puede enviarnos un mensaje a travs de Clinical cytogeneticist. Por lo general respondemos a los mensajes de MyChart en el transcurso de 1 a 2 das hbiles.  Para renovar recetas, por favor pida a su farmacia que se ponga en contacto con nuestra oficina. Annie Sable de fax es [place] [phone].  Si tiene un asunto urgente cuando la clnica est cerrada y que no puede esperar hasta el siguiente da hbil, puede llamar/localizar a su doctor(a) al nmero que aparece a continuacin.   Por favor, tenga en cuenta que aunque hacemos todo lo posible para estar disponibles para asuntos urgentes fuera del horario de [place],  no estamos disponibles las 24 horas del da, los 7 [address]  [address] de la [place].   Si tiene un problema urgente y no puede comunicarse con nosotros, puede optar por buscar atencin mdica  en el consultorio de su doctor(a), en una clnica privada, en un centro de atencin urgente o en una sala de emergencias.  Si tiene Engineer, drilling, por favor llame inmediatamente al 911 o vaya a la sala de emergencias.  Nmeros de bper  - Dr. Gwen Pounds: [phone]  - Dra. Roseanne Reno: [phone]  - Dr. Katrinka Blazing: [phone]   En caso de inclemencias del tiempo, por favor llame a Lacy Duverney principal al [phone] para una actualizacin sobre el [place] de cualquier retraso o cierre.  Consejos para la medicacin en dermatologa: Por favor, guarde las cajas en las que vienen los medicamentos de uso tpico para ayudarle a seguir las instrucciones sobre dnde y cmo usarlos. Las farmacias generalmente imprimen las instrucciones del medicamento slo en las cajas y no directamente en los tubos del [place].   Si su  medicamento es muy caro, por favor, pngase en contacto con Rolm Gala llamando al [phone] y presione la opcin 4 o envenos un mensaje a travs de Clinical cytogeneticist.   No podemos decirle cul ser su copago por los medicamentos por adelantado ya que esto es diferente dependiendo de la cobertura de su seguro. Sin embargo, es posible que podamos encontrar un medicamento sustituto a Audiological scientist un formulario para que el seguro cubra el medicamento que se considera necesario.   Si se requiere una autorizacin previa para que su compaa de seguros [place] su medicamento, por favor permtanos de 1 a 2 das hbiles para completar [address]  Los precios de los medicamentos varan con frecuencia dependiendo del Environmental consultant de dnde se surte la receta y alguna farmacias pueden ofrecer precios ms baratos.  El sitio web www.goodrx.com tiene cupones para medicamentos de Health and safety inspector. Los precios aqu no tienen en cuenta lo que podra costar con la ayuda del seguro (puede ser ms barato con su seguro), pero el sitio web puede darle el precio si no utiliz Tourist information centre manager.  - Puede imprimir el cupn correspondiente y llevarlo con su receta a la farmacia.  - Tambin puede pasar por nuestra oficina durante el horario de atencin regular y Education officer, museum una tarjeta de cupones de GoodRx.  - Si necesita que su receta se enve electrnicamente a una farmacia diferente, informe a nuestra oficina a travs de MyChart de [place] o por telfono llamando al [phone] y presione la opcin 4.

## 2023-09-23 DIAGNOSIS — Z01419 Encounter for gynecological examination (general) (routine) without abnormal findings: Secondary | ICD-10-CM | POA: Diagnosis not present

## 2023-09-23 DIAGNOSIS — Z1231 Encounter for screening mammogram for malignant neoplasm of breast: Secondary | ICD-10-CM | POA: Diagnosis not present

## 2023-09-23 DIAGNOSIS — Z6823 Body mass index (BMI) 23.0-23.9, adult: Secondary | ICD-10-CM | POA: Diagnosis not present

## 2023-11-19 DIAGNOSIS — N390 Urinary tract infection, site not specified: Secondary | ICD-10-CM | POA: Diagnosis not present

## 2023-11-19 DIAGNOSIS — R350 Frequency of micturition: Secondary | ICD-10-CM | POA: Diagnosis not present

## 2023-11-19 DIAGNOSIS — R300 Dysuria: Secondary | ICD-10-CM | POA: Diagnosis not present

## 2024-02-18 DIAGNOSIS — H9202 Otalgia, left ear: Secondary | ICD-10-CM | POA: Diagnosis not present

## 2024-02-18 DIAGNOSIS — H6502 Acute serous otitis media, left ear: Secondary | ICD-10-CM | POA: Diagnosis not present

## 2024-02-18 DIAGNOSIS — Z9889 Other specified postprocedural states: Secondary | ICD-10-CM | POA: Diagnosis not present

## 2024-06-06 ENCOUNTER — Encounter: Payer: Self-pay | Admitting: Family Medicine

## 2024-06-06 DIAGNOSIS — C642 Malignant neoplasm of left kidney, except renal pelvis: Secondary | ICD-10-CM | POA: Insufficient documentation

## 2024-06-07 NOTE — Progress Notes (Signed)
 Anna Eskenazi T. [name], MD, CAQ Sports Medicine [facility] at [facility] [address] [place], [address]  Phone: [phone]  FAX: [phone]  Mandeep Ferch - 53 y.o. female  MRN [id number]  Date of Birth: 07-05-1971  Date: 06/08/2024  PCP: Watt Mirza, MD  Referral: No ref. provider found  Chief Complaint  Patient presents with   Establish Care   Subjective:   Anna Bauer is a 53 y.o. very pleasant female patient with Body mass index is 23.98 kg/m. who presents with the following:  Discussed the use of AI scribe software for clinical note transcription with the patient, who gave verbal consent to proceed.  Patient is here for a new patient transfer of care.  She previously saw Dr. Jenetta and Dr. Velma.  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  Also has a history of osteopenia.  She has had some elevated blood pressures in the past, as well.  Sees Dr. Johnnye, mammograms  Health Maintenance  Topic Date Due   HIV Screening  Never done   Hepatitis C Screening  Never done   Mammogram  08/03/2021   Cervical Cancer Screening (HPV/Pap Cotest)  08/03/2022   COVID-19 Vaccine (3 - Moderna risk series) 06/24/2024 (Originally 01/18/2020)   Zoster Vaccines- Shingrix (1 of 2) 09/08/2024 (Originally 01/13/1990)   Influenza Vaccine  10/11/2024 (Originally 02/12/2024)   Pneumococcal Vaccine: 50+ Years (1 of 1 - PCV) 06/08/2025 (Originally 01/13/2021)   Hepatitis B Vaccines 19-59 Average Risk (1 of 3 - 19+ 3-dose series) 06/08/2025 (Originally 01/13/1990)   DTaP/Tdap/Td (2 - Td or Tdap) 01/24/2028   Colonoscopy  02/07/2031   HPV VACCINES  Aged Out   Meningococcal B Vaccine  Aged Out    Immunization History  Administered Date(s) Administered   Influenza, Seasonal, Injecte, Preservative Fre 07/28/2012   Influenza,inj,Quad PF,6+ Mos 04/22/2016, 05/31/2019   Moderna Sars-Covid-2 Vaccination  11/23/2019, 12/21/2019   Tdap 01/23/2018    History of Present Illness Anna Bauer is a 53 year old female who presents for a new primary care appointment.  She has a history of renal cancer diagnosed in 2023, treated with partial nephrectomy. she has been undergoing regular scans. She anticipates a follow-up scan early next year. She experienced some pain on the opposite side of the operated kidney, which led to the initial discovery of the cancer during a scan for gastrointestinal concerns.   She has a history of osteopenia and maintains an active lifestyle, engaging in strength training several times a week, running once a week, and walking regularly using a walking pad while working. She emphasizes her goal of optimal health and prefers to avoid medications unless necessary. Her current medication includes birth control pills, and she takes vitamin C and fish oil supplements. She previously took Claritin for allergies but now manages symptoms with Flonase as needed.  She has a history of skin cancer treated with Mohs surgery in her early twenties and has not had any recurrences. She attends annual dermatology check-ups. She reports occasional alcohol consumption, typically two to three glasses of wine on weekends. She does not smoke or use recreational drugs.  She has a family history of high blood pressure and monitors her own blood pressure regularly, noting a recent increase towards 120 mmHg as she has gained a few pounds. She wants to lose weight to manage her blood pressure better.  She has had ear surgery in the past, which involved  the placement of a prosthetic bone to improve hearing. She mentions feeling a sensation similar to a hernia during exercise, particularly when doing crunches.    Review of Systems is noted in the HPI, as appropriate  Patient Active Problem List   Diagnosis Date Noted   Clear cell renal cell carcinoma, left (HCC) 06/06/2024    Priority: High    Osteopenia 08/16/2019    Priority: Low   ASCUS of cervix with negative high risk HPV 08/16/2019    Past Medical History:  Diagnosis Date   BCC (basal cell carcinoma) 1992   R cheek tx with MOHS   Clear cell renal cell carcinoma, left (HCC) 06/06/2024   Seasonal allergies     Past Surgical History:  Procedure Laterality Date   ADENOIDECTOMY  1978   EYE SURGERY     INNER EAR SURGERY     MOHS SURGERY     around 2000   ROBOTIC ASSITED PARTIAL NEPHRECTOMY Left 01/01/2022   Procedure: XI ROBOTIC ASSITED PARTIAL NEPHRECTOMY;  Surgeon: Alvaro Hummer, MD;  Location: [facility];  Service: Urology;  Laterality: Left;  3 HRS   WISDOM TOOTH EXTRACTION      Family History  Problem Relation Age of Onset   Endometrial cancer Mother    Diabetes Mother    Hypertension Mother    Varicose Veins Mother    Colon polyps Father 69   Hypertension Father    Heart attack Father 77   Lung cancer Father        tobacco   Kidney disease Father    Hypertension Brother    Lung cancer Paternal Aunt    Diabetes Maternal Grandfather    Arthritis Paternal Grandfather    Colitis Neg Hx    Colon cancer Neg Hx    Esophageal cancer Neg Hx    Rectal cancer Neg Hx    Stomach cancer Neg Hx     Social History   Social History Narrative   05/31/19   From: the area   Living: with Deward, Husband   Work: [employer]   Pets: one medical laboratory scientific officer      Family: Son - Joey adult, lives in [place] (has a partner)      Enjoys: hiking, exercise, running      Exercise: gym - exercises 5-6 days a week - strength training and running   Diet: tries to eat clean, avoiding processed foods, simple meals      Safety   Seat belts: Yes    Guns: Yes  and secure   Safe in relationships: Yes      Objective:   BP (!) 120/90   Pulse 67   Temp 98.6 F (37 C) (Temporal)   Ht 5' 4.75 (1.645 m)   Wt 143 lb (64.9 kg)   SpO2 99%   BMI 23.98 kg/m   Ideal Body Weight: Weight in (lb) to have BMI = 25: 148.8 No results found.     06/08/2024    8:21 AM 05/31/2019   11:21 AM 04/16/2015   11:16 AM  Depression screen PHQ 2/9  Decreased Interest 0 0 0  Down, Depressed, Hopeless 0 0 0  PHQ - 2 Score 0 0 0     GEN: well developed, well nourished, no acute distress Eyes: conjunctiva and lids normal, PERRLA, EOMI ENT: TM clear, nares clear, oral exam WNL Neck: supple, no lymphadenopathy, no thyromegaly, no JVD Pulm: clear to auscultation and percussion, respiratory effort normal CV: regular rate and rhythm, S1-S2, no  murmur, rub or gallop, no bruits Chest: no scars, masses, no lumps BREAST: defer GI: soft, non-tender; no hepatosplenomegaly, masses; active bowel sounds all quadrants GU: defer Lymph: no cervical, axillary or inguinal adenopathy MSK: gait normal, muscle tone and strength WNL, no joint swelling, effusions, discoloration, crepitus  SKIN: clear, good turgor, color WNL, no rashes, lesions, or ulcerations Neuro: normal mental status, normal strength, sensation, and motion Psych: alert; oriented to person, place and time, normally interactive and not anxious or depressed in appearance.   Laboratory and Imaging Data:  Assessment and Plan:     ICD-10-CM   1. Healthcare maintenance  Z00.00     2. Clear cell renal cell carcinoma, left (HCC)  C64.2     3. Osteopenia, unspecified location  M85.80     4. Screening for diabetes mellitus  Z13.1 Basic metabolic panel with GFR    Hemoglobin A1c    5. Screening for HIV (human immunodeficiency virus)  Z11.4 HIV Antibody (routine testing w rflx)    6. Other fatigue  R53.83 Basic metabolic panel with GFR    CBC with Differential/Platelet    Hepatic function panel    TSH    VITAMIN D  25 Hydroxy (Vit-D Deficiency, Fractures)    Vitamin B12    Magnesium    7. Need for hepatitis C screening test  Z11.59 Hepatitis C antibody    8. Screening, lipid  Z13.220 Lipid panel     Assessment & Plan Adult Wellness Visit Routine wellness visit focused on lifestyle  modifications for optimal health. Discussed alcohol consumption and reviewed medications and supplements. - Ordered lab work including HIV, hep C, B12, vitamin D , TSH, cholesterol, A1c, liver function, kidney function, blood count, and magnesium. - Encouraged healthy diet and regular exercise. - Advised limiting alcohol consumption to one glass of wine per day.  History of malignant neoplasm of left kidney Stage 1 vs 2 renal cancer treated with nephrectomy in 2023. Low recurrence risk. Follow-up scans ongoing with potential discontinuation after next scan per oncologist. - Continue follow-up with oncologist for renal cancer surveillance.  Osteopenia Managed with regular exercise and healthy diet. - Continue strength training and healthy diet.  Elevated blood pressure reading without hypertension Elevated diastolic blood pressure noted, but home readings normal. No antihypertensive medication needed. Emphasized lifestyle modifications. - Encouraged healthy diet and regular exercise.  Allergic rhinitis Managed with occasional Flonase use. Previously used Claritin daily but discontinued. - Continue as needed use of Flonase for symptom management.  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  History of skin cancer, status post Mohs surgery Skin cancer treated with Mohs surgery 30-40 years ago. No recurrence. Regular annual dermatology visits for skin checks. - Continue annual dermatology visits for skin checks.  Medication Management during today's office visit: No orders of the defined types were placed in this encounter.  Medications Discontinued During This Encounter  Medication Reason   docusate sodium  (COLACE) 100 MG capsule Completed Course   HYDROcodone -acetaminophen  (NORCO) 5-325 MG tablet Completed Course   loratadine (CLARITIN) 10 MG tablet Completed Course   docusate sodium   (COLACE) 100 MG capsule Completed Course   HYDROcodone -acetaminophen  (NORCO) 5-325 MG tablet Completed Course   loratadine (CLARITIN) 10 MG tablet Completed Course    Orders placed today for conditions managed today: Orders Placed This Encounter  Procedures   HIV Antibody (routine testing w rflx)   Hepatitis C antibody   Basic metabolic panel with  GFR   CBC with Differential/Platelet   Hepatic function panel   Hemoglobin A1c   Lipid panel   TSH   VITAMIN D  25 Hydroxy (Vit-D Deficiency, Fractures)   Vitamin B12   Magnesium    Disposition: No follow-ups on file.  Dragon [employer] One speech-to-text software was used for transcription in this dictation.  Possible transcriptional errors can occur using Animal nutritionist.   Signed,  Jacques DASEN. Tremont Gavitt, MD   Outpatient Encounter Medications as of 06/08/2024  Medication Sig   Ascorbic Acid (VITAMIN C) 1000 MG tablet Take 1,000 mg by mouth daily.   JUNEL  1/20 1-20 MG-MCG tablet Take 1 tablet by mouth daily.   Omega-3 Fatty Acids (FISH OIL PO) Take 2 tablets by mouth daily.   [DISCONTINUED] docusate sodium  (COLACE) 100 MG capsule Take 1 capsule (100 mg total) by mouth 2 (two) times daily.   [DISCONTINUED] HYDROcodone -acetaminophen  (NORCO) 5-325 MG tablet Take 1-2 tablets by mouth every 6 (six) hours as needed for moderate pain or severe pain.   [DISCONTINUED] loratadine (CLARITIN) 10 MG tablet Take 10 mg by mouth daily.   No facility-administered encounter medications on file as of 06/08/2024.

## 2024-06-08 ENCOUNTER — Ambulatory Visit: Admitting: Family Medicine

## 2024-06-08 ENCOUNTER — Encounter: Payer: Self-pay | Admitting: Family Medicine

## 2024-06-08 VITALS — BP 120/90 | HR 67 | Temp 98.6°F | Ht 64.75 in | Wt 143.0 lb

## 2024-06-08 DIAGNOSIS — R030 Elevated blood-pressure reading, without diagnosis of hypertension: Secondary | ICD-10-CM

## 2024-06-08 DIAGNOSIS — M8580 Other specified disorders of bone density and structure, unspecified site: Secondary | ICD-10-CM

## 2024-06-08 DIAGNOSIS — Z0000 Encounter for general adult medical examination without abnormal findings: Secondary | ICD-10-CM | POA: Diagnosis not present

## 2024-06-08 DIAGNOSIS — Z1159 Encounter for screening for other viral diseases: Secondary | ICD-10-CM | POA: Diagnosis not present

## 2024-06-08 DIAGNOSIS — Z13220 Encounter for screening for lipoid disorders: Secondary | ICD-10-CM

## 2024-06-08 DIAGNOSIS — Z114 Encounter for screening for human immunodeficiency virus [HIV]: Secondary | ICD-10-CM

## 2024-06-08 DIAGNOSIS — C642 Malignant neoplasm of left kidney, except renal pelvis: Secondary | ICD-10-CM | POA: Diagnosis not present

## 2024-06-08 DIAGNOSIS — Z131 Encounter for screening for diabetes mellitus: Secondary | ICD-10-CM

## 2024-06-08 DIAGNOSIS — R5383 Other fatigue: Secondary | ICD-10-CM

## 2024-06-09 ENCOUNTER — Ambulatory Visit: Payer: Self-pay | Admitting: Family Medicine

## 2024-06-10 LAB — LIPID PANEL
Chol/HDL Ratio: 2.6 ratio (ref 0.0–4.4)
Cholesterol, Total: 173 mg/dL (ref 100–199)
HDL: 66 mg/dL (ref >39)
LDL Chol Calc (NIH): 92 mg/dL (ref 0–99)
Triglycerides: 84 mg/dL (ref 0–149)
VLDL Cholesterol Cal: 15 mg/dL (ref 5–40)

## 2024-06-10 LAB — CBC WITH DIFFERENTIAL/PLATELET
Basophils Absolute: 0.1 x10E3/uL (ref 0.0–0.2)
Basos: 1 %
EOS (ABSOLUTE): 0.2 x10E3/uL (ref 0.0–0.4)
Eos: 3 %
Hematocrit: 43.7 % (ref 34.0–46.6)
Hemoglobin: 13.7 g/dL (ref 11.1–15.9)
Immature Grans (Abs): 0 x10E3/uL (ref 0.0–0.1)
Immature Granulocytes: 0 %
Lymphocytes Absolute: 1.6 x10E3/uL (ref 0.7–3.1)
Lymphs: 21 %
MCH: 32.5 pg (ref 26.6–33.0)
MCHC: 31.4 g/dL — ABNORMAL LOW (ref 31.5–35.7)
MCV: 104 fL — ABNORMAL HIGH (ref 79–97)
Monocytes Absolute: 0.6 x10E3/uL (ref 0.1–0.9)
Monocytes: 8 %
Neutrophils Absolute: 4.9 x10E3/uL (ref 1.4–7.0)
Neutrophils: 67 %
Platelets: 329 x10E3/uL (ref 150–450)
RBC: 4.22 x10E6/uL (ref 3.77–5.28)
RDW: 12.7 % (ref 11.7–15.4)
WBC: 7.3 x10E3/uL (ref 3.4–10.8)

## 2024-06-10 LAB — HIV ANTIBODY (ROUTINE TESTING W REFLEX): HIV Screen 4th Generation wRfx: NONREACTIVE

## 2024-06-10 LAB — TSH: TSH: 2.39 u[IU]/mL (ref 0.450–4.500)

## 2024-06-10 LAB — BASIC METABOLIC PANEL WITH GFR
BUN/Creatinine Ratio: 13 (ref 9–23)
BUN: 11 mg/dL (ref 6–24)
CO2: 21 mmol/L (ref 20–29)
Calcium: 10.2 mg/dL (ref 8.7–10.2)
Chloride: 102 mmol/L (ref 96–106)
Creatinine, Ser: 0.82 mg/dL (ref 0.57–1.00)
Glucose: 74 mg/dL (ref 70–99)
Potassium: 4.4 mmol/L (ref 3.5–5.2)
Sodium: 138 mmol/L (ref 134–144)
eGFR: 85 mL/min/1.73 (ref >59)

## 2024-06-10 LAB — HEMOGLOBIN A1C
Est. average glucose Bld gHb Est-mCnc: 88 mg/dL
Hgb A1c MFr Bld: 4.7 % — ABNORMAL LOW (ref 4.8–5.6)

## 2024-06-10 LAB — HEPATIC FUNCTION PANEL
ALT: 9 IU/L (ref 0–32)
AST: 17 IU/L (ref 0–40)
Albumin: 4.4 g/dL (ref 3.8–4.9)
Alkaline Phosphatase: 46 IU/L — ABNORMAL LOW (ref 49–135)
Bilirubin Total: 0.4 mg/dL (ref 0.0–1.2)
Bilirubin, Direct: 0.14 mg/dL (ref 0.00–0.40)
Total Protein: 7.4 g/dL (ref 6.0–8.5)

## 2024-06-10 LAB — HEPATITIS C ANTIBODY: Hep C Virus Ab: NONREACTIVE

## 2024-06-10 LAB — VITAMIN D 25 HYDROXY (VIT D DEFICIENCY, FRACTURES): Vit D, 25-Hydroxy: 45.8 ng/mL (ref 30.0–100.0)

## 2024-06-10 LAB — VITAMIN B12: Vitamin B-12: 426 pg/mL (ref 232–1245)

## 2024-06-10 LAB — MAGNESIUM: Magnesium: 2 mg/dL (ref 1.6–2.3)

## 2024-07-22 ENCOUNTER — Ambulatory Visit (HOSPITAL_COMMUNITY)
Admission: RE | Admit: 2024-07-22 | Discharge: 2024-07-22 | Disposition: A | Source: Ambulatory Visit | Attending: Urology | Admitting: Urology

## 2024-07-22 ENCOUNTER — Other Ambulatory Visit (HOSPITAL_COMMUNITY): Payer: Self-pay | Admitting: Urology

## 2024-07-22 DIAGNOSIS — C642 Malignant neoplasm of left kidney, except renal pelvis: Secondary | ICD-10-CM

## 2024-08-03 ENCOUNTER — Telehealth: Admitting: Physician Assistant

## 2024-08-03 DIAGNOSIS — R3989 Other symptoms and signs involving the genitourinary system: Secondary | ICD-10-CM | POA: Diagnosis not present

## 2024-08-03 MED ORDER — CEPHALEXIN 500 MG PO CAPS: 500.0000 mg | ORAL_CAPSULE | Freq: Two times a day (BID) | ORAL | 0 refills | Status: AC

## 2024-08-03 NOTE — Progress Notes (Signed)

## 2024-10-25 ENCOUNTER — Encounter: Admitting: Dermatology

## 6277-12-12 DEATH — deceased
